# Patient Record
Sex: Female | Born: 1995 | Race: Black or African American | Hispanic: Yes | Marital: Single | Smoking: Current some day smoker
Health system: Southern US, Community
[De-identification: ages and names within clinical notes are randomized; demographics above are authoritative.]

## PROBLEM LIST (undated history)

## (undated) DIAGNOSIS — F319 Bipolar disorder, unspecified: Secondary | ICD-10-CM

## (undated) DIAGNOSIS — F909 Attention-deficit hyperactivity disorder, unspecified type: Secondary | ICD-10-CM

## (undated) DIAGNOSIS — G4700 Insomnia, unspecified: Secondary | ICD-10-CM

## (undated) DIAGNOSIS — F419 Anxiety disorder, unspecified: Secondary | ICD-10-CM

---
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
## 2018-12-18 ENCOUNTER — Emergency Department (HOSPITAL_COMMUNITY)
Admission: EM | Admit: 2018-12-18 | Discharge: 2018-12-18 | Disposition: A | Discharge status: Home / Self Care | Payer: Self-pay | Attending: Emergency Medicine | Admitting: Emergency Medicine

## 2018-12-18 ENCOUNTER — Emergency Department (HOSPITAL_COMMUNITY): Payer: Self-pay

## 2018-12-18 ENCOUNTER — Other Ambulatory Visit: Payer: Self-pay

## 2018-12-18 ENCOUNTER — Encounter (HOSPITAL_COMMUNITY): Payer: Self-pay

## 2018-12-18 DIAGNOSIS — D271 Benign neoplasm of left ovary: Secondary | ICD-10-CM | POA: Insufficient documentation

## 2018-12-18 DIAGNOSIS — N946 Dysmenorrhea, unspecified: Secondary | ICD-10-CM | POA: Insufficient documentation

## 2018-12-18 DIAGNOSIS — Z79899 Other long term (current) drug therapy: Secondary | ICD-10-CM | POA: Insufficient documentation

## 2018-12-18 DIAGNOSIS — F17200 Nicotine dependence, unspecified, uncomplicated: Secondary | ICD-10-CM | POA: Insufficient documentation

## 2018-12-18 DIAGNOSIS — R102 Pelvic and perineal pain: Secondary | ICD-10-CM

## 2018-12-18 DIAGNOSIS — F419 Anxiety disorder, unspecified: Secondary | ICD-10-CM

## 2018-12-18 HISTORY — DX: Anxiety disorder, unspecified: F41.9

## 2018-12-18 LAB — COMPREHENSIVE METABOLIC PANEL
ALK PHOS: 71 U/L (ref 38–126)
ALT: 11 U/L (ref 0–44)
AST: 19 U/L (ref 15–41)
Albumin: 4.2 g/dL (ref 3.5–5.0)
Anion gap: 10 (ref 5–15)
BUN: 10 mg/dL (ref 6–20)
CALCIUM: 9.2 mg/dL (ref 8.9–10.3)
CO2: 25 mmol/L (ref 22–32)
Chloride: 104 mmol/L (ref 98–111)
Creatinine, Ser: 0.83 mg/dL (ref 0.44–1.00)
GFR calc Af Amer: >60 mL/min (ref >60)
GFR calc non Af Amer: >60 mL/min (ref >60)
Glucose, Bld: 104 mg/dL — ABNORMAL HIGH (ref 70–99)
Potassium: 2.9 mmol/L — ABNORMAL LOW (ref 3.5–5.1)
SODIUM: 139 mmol/L (ref 135–145)
Total Bilirubin: 0.3 mg/dL (ref 0.3–1.2)
Total Protein: 8.3 g/dL — ABNORMAL HIGH (ref 6.5–8.1)

## 2018-12-18 LAB — I-STAT BETA HCG BLOOD, ED (MC, WL, AP ONLY): I-stat hCG, quantitative: <5 m[IU]/mL (ref <5)

## 2018-12-18 LAB — CBC WITH DIFFERENTIAL/PLATELET
Abs Immature Granulocytes: 0.03 10*3/uL (ref 0.00–0.07)
Basophils Absolute: 0.1 10*3/uL (ref 0.0–0.1)
Basophils Relative: 1 %
EOS ABS: 0.5 10*3/uL (ref 0.0–0.5)
Eosinophils Relative: 6 %
HCT: 46.2 % — ABNORMAL HIGH (ref 36.0–46.0)
Hemoglobin: 14.7 g/dL (ref 12.0–15.0)
IMMATURE GRANULOCYTES: 0 %
Lymphocytes Relative: 31 %
Lymphs Abs: 2.7 10*3/uL (ref 0.7–4.0)
MCH: 27.6 pg (ref 26.0–34.0)
MCHC: 31.8 g/dL (ref 30.0–36.0)
MCV: 86.8 fL (ref 80.0–100.0)
Monocytes Absolute: 0.9 10*3/uL (ref 0.1–1.0)
Monocytes Relative: 10 %
Neutro Abs: 4.7 10*3/uL (ref 1.7–7.7)
Neutrophils Relative %: 52 %
Platelets: 362 10*3/uL (ref 150–400)
RBC: 5.32 MIL/uL — ABNORMAL HIGH (ref 3.87–5.11)
RDW: 14.6 % (ref 11.5–15.5)
WBC: 8.9 10*3/uL (ref 4.0–10.5)
nRBC: 0 % (ref 0.0–0.2)

## 2018-12-18 LAB — WET PREP, GENITAL
Sperm: NONE SEEN
Trich, Wet Prep: NONE SEEN
Yeast Wet Prep HPF POC: NONE SEEN

## 2018-12-18 MED ORDER — NAPROXEN SODIUM 550 MG PO TABS: 550.0000 mg | ORAL_TABLET | Freq: Two times a day (BID) | ORAL | 0 refills | Status: DC

## 2018-12-18 MED ORDER — KETOROLAC TROMETHAMINE 60 MG/2ML IM SOLN
30.0000 mg | Freq: Once | INTRAMUSCULAR | Status: AC
  Administered 2018-12-18: 30 mg via INTRAMUSCULAR
  Filled 2018-12-18: qty 2

## 2018-12-18 NOTE — ED Triage Notes (Signed)
Patient from home. Patient is AOx4 and ambulatory. Patient presents today with mid suprapubic pain that began Monday afternoon. Patient states that pain is always present and increases and decreases in intensity, however when walking pain is somewhat relieved. Patient states she uses morning after pill for birth control now due to not being able to afford other forms of Brith control.

## 2018-12-18 NOTE — Discharge Instructions (Addendum)
You have cultures pending. If they are positive someone will call you. You will need to follow up with your health care provider for recheck in a couple weeks due to the cyst on your ovary or you can call the GYN office to schedule follow up. Take the medication as directed for pain. Return here as needed.

## 2018-12-18 NOTE — ED Provider Notes (Signed)
[facility] DEPT Provider Note   CSN: [id number] Arrival date & time: 12/18/18  1133    History   Chief Complaint Chief Complaint  Patient presents with   Abdominal Pain    HPI Jenna Hunt is a 23 y.o. female who presents to the ED with lower abdominal cramping that started yesterday. Patient reports menses currently and she often gets cramps before her period. Patient uses condoms and "morning after pill" for birth control.     HPI  Past Medical History:  Diagnosis Date   Anxiety 12/18/2018   Patient states she has anxiety, unable to verify.    There are no active problems to display for this patient.   History reviewed. No pertinent surgical history.   OB History   No obstetric history on file.      Home Medications    Prior to Admission medications   Medication Sig Start Date End Date Taking? Authorizing Provider  Sertraline HCl (ZOLOFT PO) Take 1 tablet by mouth daily.   Yes [provider]  naproxen sodium (ANAPROX DS) 550 MG tablet Take 1 tablet (550 mg total) by mouth 2 (two) times daily with a meal. 12/18/18   Ashley Murrain, NP    Family History No family history on file.  Social History Social History   Tobacco Use   Smoking status: Current Some Day Smoker    Packs/day: 0.10    Years: 10.00    Pack years: 1.00   Smokeless tobacco: Never Used  Substance Use Topics   Alcohol use: Yes    Comment: social drinker   Drug use: Not Currently    Comment: CBD oil now.      Allergies   Patient has no known allergies.   Review of Systems Review of Systems  Constitutional: Positive for chills. Negative for fever.  HENT: Negative.   Eyes: Negative for discharge, redness, itching and visual disturbance.  Respiratory: Negative for cough and shortness of breath.   Cardiovascular: Negative for chest pain.  Gastrointestinal: Positive for abdominal pain. Negative for nausea and vomiting.  Genitourinary:  Positive for vaginal bleeding. Negative for dysuria, frequency, urgency and vaginal discharge.  Musculoskeletal: Negative for back pain and myalgias.  Skin: Negative for rash.  Neurological: Negative for syncope and headaches.  Psychiatric/Behavioral: Negative for confusion.     Physical Exam Updated Vital Signs BP 137/87    Pulse (!) 59    Temp 98.2 F (36.8 C) (Oral)    Resp 15    Ht 5\' 1"  (1.549 m)    Wt 59 kg    LMP 12/18/2018 (Exact Date)    SpO2 100%    BMI 24.56 kg/m   Physical Exam Vitals signs and nursing note reviewed.  Constitutional:      General: She is not in acute distress.    Appearance: She is well-developed.  HENT:     Head: Normocephalic.  Neck:     Musculoskeletal: Neck supple.  Cardiovascular:     Rate and Rhythm: Normal rate.  Pulmonary:     Effort: Pulmonary effort is normal.  Abdominal:     Palpations: Abdomen is soft.     Tenderness: There is abdominal tenderness in the right lower quadrant. There is no guarding or rebound.  Genitourinary:    Cervix: No cervical motion tenderness.     Uterus: Normal.      Adnexa:        Right: Tenderness present.      Comments:  External genitalia without lesions, scant blood vaginal vault. Right adnexal tenderness.  Musculoskeletal: Normal range of motion.  Skin:    General: Skin is warm and dry.  Neurological:     Mental Status: She is alert and oriented to person, place, and time.     Cranial Nerves: No cranial nerve deficit.  Psychiatric:        Mood and Affect: Mood normal.      ED Treatments / Results  Labs (all labs ordered are listed, but only abnormal results are displayed) Labs Reviewed  WET PREP, GENITAL - Abnormal; Notable for the following components:      Result Value   Clue Cells Wet Prep HPF POC PRESENT (*)    WBC, Wet Prep HPF POC FEW (*)    All other components within normal limits  CBC WITH DIFFERENTIAL/PLATELET - Abnormal; Notable for the following components:   RBC 5.32 (*)    HCT  46.2 (*)    All other components within normal limits  COMPREHENSIVE METABOLIC PANEL - Abnormal; Notable for the following components:   Potassium 2.9 (*)    Glucose, Bld 104 (*)    Total Protein 8.3 (*)    All other components within normal limits  HIV ANTIBODY (ROUTINE TESTING W REFLEX)  RPR  I-STAT BETA HCG BLOOD, ED ([facility], [facility], [facility] ONLY)  GC/CHLAMYDIA PROBE AMP ([place]) NOT AT [facility]   Radiology US Pelvic Complete W Transvaginal And Torsion R/o  Result Date: 12/18/2018 CLINICAL DATA:  Pelvic pain in a female EXAM: TRANSABDOMINAL AND TRANSVAGINAL ULTRASOUND OF PELVIS DOPPLER ULTRASOUND OF OVARIES TECHNIQUE: Both transabdominal and transvaginal ultrasound examinations of the pelvis were performed. Transabdominal technique was performed for global imaging of the pelvis including uterus, ovaries, adnexal regions, and pelvic cul-de-sac. It was necessary to proceed with endovaginal exam following the transabdominal exam to visualize the uterus, endometrium, and ovaries. Color and duplex Doppler ultrasound was utilized to evaluate blood flow to the ovaries. COMPARISON:  None FINDINGS: Uterus Measurements: 6.9 x 4.0 x 4.1 cm = volume: 59.1 mL. Retroverted. Normal morphology without mass Endometrium Thickness: 5 mm, normal.  No endometrial fluid or focal abnormality Right ovary Measurements: 3.8 x 1.7 x 1.9 cm = volume: 6.3 mL. Normal morphology without mass Left ovary Measurements: 3.4 x 1.9 x 1.9 cm = volume: 6.5 mL. Small hyperechoic nodule without shadowing 10 x 6 x 8 mm question tiny dermoid tumor. Pulsed Doppler evaluation of both ovaries demonstrates normal low-resistance arterial and venous waveforms. Other findings Small amount of free pelvic fluid at cul-de-sac. No adnexal masses otherwise seen. IMPRESSION: Question small 10 mm dermoid tumor of the LEFT ovary. Otherwise normal appearing pelvic ultrasound. No sonographic evidence of ovarian torsion. Electronically Signed   By: Lavonia Dana M.D.    On: 12/18/2018 15:05    Procedures Procedures (including critical care time)  Medications Ordered in ED Medications  ketorolac (TORADOL) injection 30 mg (30 mg Intramuscular Given 12/18/18 1253)   Symptoms improved with treatment in the ED.  Initial Impression / Assessment and Plan / ED Course  I have reviewed the triage vital signs and the nursing notes. 23 y.o. female here with pelvic pain stable for d/c without TOA, torsion or mass. Patient does have a small area that appears as a dermoid cyst. Patient will f[facility] with [facility]. Will treat with NSAIDS.   Final Clinical Impressions(s) / ED Diagnoses   Final diagnoses:  Pelvic pain in female  Dermoid cyst of  left ovary  Dysmenorrhea    ED Discharge Orders         Ordered    naproxen sodium (ANAPROX DS) 550 MG tablet  2 times daily with meals     12/18/18 1526           Janit Bern Venedy, NP 12/18/18 Wiseman, Ankit, MD 12/19/18 1312

## 2018-12-19 LAB — GC/CHLAMYDIA PROBE AMP (~~LOC~~) NOT AT ARMC
Chlamydia: NEGATIVE
Neisseria Gonorrhea: NEGATIVE

## 2018-12-19 LAB — HIV ANTIBODY (ROUTINE TESTING W REFLEX): HIV Screen 4th Generation wRfx: NONREACTIVE

## 2018-12-19 LAB — RPR: RPR Ser Ql: NONREACTIVE

## 2019-01-27 ENCOUNTER — Emergency Department (HOSPITAL_COMMUNITY)
Admission: EM | Admit: 2019-01-27 | Discharge: 2019-01-29 | Disposition: A | Discharge status: Other Acute Inpatient Hospital | Payer: Self-pay | Attending: Emergency Medicine | Admitting: Emergency Medicine

## 2019-01-27 DIAGNOSIS — F1014 Alcohol abuse with alcohol-induced mood disorder: Secondary | ICD-10-CM | POA: Diagnosis present

## 2019-01-27 DIAGNOSIS — F17210 Nicotine dependence, cigarettes, uncomplicated: Secondary | ICD-10-CM | POA: Insufficient documentation

## 2019-01-27 DIAGNOSIS — F10230 Alcohol dependence with withdrawal, uncomplicated: Secondary | ICD-10-CM | POA: Diagnosis present

## 2019-01-27 DIAGNOSIS — R45851 Suicidal ideations: Secondary | ICD-10-CM | POA: Insufficient documentation

## 2019-01-27 DIAGNOSIS — R4689 Other symptoms and signs involving appearance and behavior: Secondary | ICD-10-CM | POA: Insufficient documentation

## 2019-01-27 DIAGNOSIS — R4182 Altered mental status, unspecified: Secondary | ICD-10-CM | POA: Insufficient documentation

## 2019-01-27 DIAGNOSIS — F1410 Cocaine abuse, uncomplicated: Secondary | ICD-10-CM | POA: Diagnosis present

## 2019-01-27 DIAGNOSIS — F3131 Bipolar disorder, current episode depressed, mild: Secondary | ICD-10-CM | POA: Insufficient documentation

## 2019-01-27 LAB — COMPREHENSIVE METABOLIC PANEL
ALT: 12 U/L (ref 0–44)
AST: 21 U/L (ref 15–41)
Albumin: 4 g/dL (ref 3.5–5.0)
Alkaline Phosphatase: 67 U/L (ref 38–126)
Anion gap: 9 (ref 5–15)
BUN: 5 mg/dL — ABNORMAL LOW (ref 6–20)
CO2: 25 mmol/L (ref 22–32)
Calcium: 8.6 mg/dL — ABNORMAL LOW (ref 8.9–10.3)
Chloride: 107 mmol/L (ref 98–111)
Creatinine, Ser: 0.71 mg/dL (ref 0.44–1.00)
GFR calc Af Amer: >60 mL/min (ref >60)
GFR calc non Af Amer: >60 mL/min (ref >60)
Glucose, Bld: 104 mg/dL — ABNORMAL HIGH (ref 70–99)
Potassium: 2.8 mmol/L — ABNORMAL LOW (ref 3.5–5.1)
Sodium: 141 mmol/L (ref 135–145)
Total Bilirubin: 0.6 mg/dL (ref 0.3–1.2)
Total Protein: 7.5 g/dL (ref 6.5–8.1)

## 2019-01-27 LAB — CBC
HCT: 44.9 % (ref 36.0–46.0)
Hemoglobin: 14.6 g/dL (ref 12.0–15.0)
MCH: 28.1 pg (ref 26.0–34.0)
MCHC: 32.5 g/dL (ref 30.0–36.0)
MCV: 86.5 fL (ref 80.0–100.0)
Platelets: 324 10*3/uL (ref 150–400)
RBC: 5.19 MIL/uL — ABNORMAL HIGH (ref 3.87–5.11)
RDW: 13.5 % (ref 11.5–15.5)
WBC: 14.4 10*3/uL — ABNORMAL HIGH (ref 4.0–10.5)
nRBC: 0 % (ref 0.0–0.2)

## 2019-01-27 LAB — I-STAT BETA HCG BLOOD, ED (MC, WL, AP ONLY): I-stat hCG, quantitative: <5 m[IU]/mL (ref <5)

## 2019-01-27 MED ORDER — SODIUM CHLORIDE 0.9 % IV BOLUS
1000.0000 mL | Freq: Once | INTRAVENOUS | Status: AC
  Administered 2019-01-27: 1000 mL via INTRAVENOUS

## 2019-01-27 NOTE — ED Notes (Signed)
Bed: QB34 Expected date:  Expected time:  Means of arrival:  Comments: Held/combative female

## 2019-01-27 NOTE — ED Notes (Signed)
Patient arrives by [facility] with [employer] restrained to EMS stretcher-patient has abrasions to both knees with bleeding controlled-small abrasions x 2 around outer right nare and right front tooth noted to be broken on exam

## 2019-01-27 NOTE — ED Provider Notes (Signed)
Medical screening examination/treatment/procedure(s) were conducted as a shared visit with non-physician practitioner(s) and myself.  I personally evaluated the patient during the encounter. Briefly, the patient is a 23 y.o. female with no significant medical history presents the ED with aggressive and combative behavior, she was suicidal with EMS.  Due to being combative she got IM Versed and is now sedated.  Was ambulatory and trying to elope from the EMS.  Patient responds to pain, breathing on her own, however is sedated.  Pupils are overall normal.  We will get head CT, psych screening labs.  Will place on end-tidal CO2 and allow her to metabolize substances.  Right now patient protecting airway.  After she metabolizes Versed and other possible substances will likely need psychiatric evaluation.  Will re-evaluate after she metabolizes to figure out what is going on with the patient.  Physician assistant will continue work-up.  This chart was dictated using voice recognition software.  Despite best efforts to proofread,  errors can occur which can change the documentation meaning.     EKG Interpretation  Date/Time:  Sunday January 27 2019 22:40:52 EDT Ventricular Rate:  66 PR Interval:    QRS Duration: 101 QT Interval:  406 QTC Calculation: 426 R Axis:   10 Text Interpretation:  Sinus rhythm RSR' in V1 or V2, probably normal variant Baseline wander in lead(s) V6 Confirmed by Lennice Sites [phone]) on 01/27/2019 10:53:44 PM           Lennice Sites, DO 01/28/19 0013

## 2019-01-27 NOTE — ED Provider Notes (Signed)
[facility] DEPT Provider Note   CSN: [id number] Arrival date & time: 01/27/19  2232    History   Chief Complaint Chief Complaint  Patient presents with   Aggressive Behavior    HPI Jenna Hunt is a 23 y.o. female.     Patient to ED by EMS for evaluation of SI, altered mental status, combative behavior. Police report they were called in regard to the patient walking down the middle of the road, altered, stating she wanted to kill herself. During transportation she started vomiting in the police car, became combative, and EMS was called. The patient was medicated with Versed in route and arrives unconscious, unable to contribute to history. [employer] filing an IVC petition.   The history is provided by the EMS personnel and the police. No language interpreter was used.    Past Medical History:  Diagnosis Date   Anxiety 12/18/2018   Patient states she has anxiety, unable to verify.    There are no active problems to display for this patient.   No past surgical history on file.   OB History   No obstetric history on file.      Home Medications    Prior to Admission medications   Medication Sig Start Date End Date Taking? Authorizing Provider  naproxen sodium (ANAPROX DS) 550 MG tablet Take 1 tablet (550 mg total) by mouth 2 (two) times daily with a meal. 12/18/18   Neese, [place], NP  Sertraline HCl (ZOLOFT PO) Take 1 tablet by mouth daily.    [provider]    Family History No family history on file.  Social History Social History   Tobacco Use   Smoking status: Current Some Day Smoker    Packs/day: 0.10    Years: 10.00    Pack years: 1.00   Smokeless tobacco: Never Used  Substance Use Topics   Alcohol use: Yes    Comment: social drinker   Drug use: Not Currently    Comment: CBD oil now.      Allergies   Patient has no known allergies.   Review of Systems Review of Systems  Unable to perform ROS:  Patient unresponsive     Physical Exam Updated Vital Signs BP 104/71 (BP Location: Left Arm)    Pulse 69    Resp 19    Ht 5\' 6"  (1.676 m)    Wt 65.8 kg    SpO2 100%    BMI 23.40 kg/m   Physical Exam Vitals signs and nursing note reviewed.  HENT:     Mouth/Throat:     Mouth: Mucous membranes are moist.  Eyes:     Comments: Pupils pinpoint bilaterally.  Cardiovascular:     Rate and Rhythm: Normal rate.  Pulmonary:     Effort: Pulmonary effort is normal.  Abdominal:     Palpations: Abdomen is soft.  Musculoskeletal:     Comments: No bony deformities.  Skin:    Comments: Abrasion to anterior left knee. Minor facial abrasions to inferior right eye and right paranasal area.   Neurological:     Comments: Unable to test. Minimal response to painful stimuli.      ED Treatments / Results  Labs (all labs ordered are listed, but only abnormal results are displayed) Labs Reviewed  COMPREHENSIVE METABOLIC PANEL  ETHANOL  SALICYLATE LEVEL  ACETAMINOPHEN LEVEL  CBC  RAPID URINE DRUG SCREEN, HOSP PERFORMED  I-STAT BETA HCG BLOOD, ED ([facility], [facility], [facility] ONLY)  EKG None  Radiology No results found.  Procedures Procedures (including critical care time) CRITICAL CARE Performed by: Dewaine Oats   Total critical care time: 50 minutes  Critical care time was exclusive of separately billable procedures and treating other patients.  Critical care was necessary to treat or prevent imminent or life-threatening deterioration.  Critical care was time spent personally by me on the following activities: development of treatment plan with patient and/or surrogate as well as nursing, discussions with consultants, evaluation of patient's response to treatment, examination of patient, obtaining history from patient or surrogate, ordering and performing treatments and interventions, ordering and review of laboratory studies, ordering and review of radiographic studies, pulse oximetry and  re-evaluation of patient's condition.  Medications Ordered in ED Medications  sodium chloride 0.9 % bolus 1,000 mL (has no administration in time range)     Initial Impression / Assessment and Plan / ED Course  I have reviewed the triage vital signs and the nursing notes.  Pertinent labs & imaging results that were available during my care of the patient were reviewed by me and considered in my medical decision making (see chart for details).        Patient to ED by EMS and [employer], picked up for altered mental status, stating SI, combative. Medicated prior to arrival and sedated on presentation. VSS. Mildly hypotensive, similar to previous charted VS. O2 saturations 90-91% - started on 2L.   1:00 - patient returning from CT. Still sedated but more easily arousable. Labs support alcohol intoxication. Mild hypokalemia - supplemental K+ ordered.    1:40 - Head CT negative for acute bleed or evidence of trauma. VSS.   3:00 - patient still sleeping. VSS. Temperature improving with bair hugger.   4:30 - patient wakens to verbal stimuli, but falls back to sleep quickly. She remembers being at the [employer] [facility] but can give no other details of history. With work up being negative for acute infection or evidence of trauma. AMS felt likely due to intoxication. IVC petition in place by [employer]. Will request TTS consultation when she is more awake.   6:00 - TTS consult delayed due to teleprompter not functioning.   Patient care signed out to Dr. Stark Jock pending TTS consultation and disposition recommendation.  Final Clinical Impressions(s) / ED Diagnoses   Final diagnoses:  None   1. Altered mental status 2. Intoxication 3. Suicidal ideation  ED Discharge Orders    None       Charlann Lange, Hershal Coria 01/29/19 0636    Lennice Sites, DO 01/30/19 1000

## 2019-01-27 NOTE — ED Triage Notes (Signed)
Pt arrived via [facility] due to a call about her wandering in traffic, per EMS pt stated she had SI. Pt on the way to hospital became aggressive, pt spitting, hitting, and threatening and attempting to pee on EMS staff. Given 5mg  Versed IM. IVC paperwork is in process.

## 2019-01-28 ENCOUNTER — Other Ambulatory Visit: Payer: Self-pay

## 2019-01-28 ENCOUNTER — Emergency Department (HOSPITAL_COMMUNITY): Payer: Self-pay

## 2019-01-28 DIAGNOSIS — F1014 Alcohol abuse with alcohol-induced mood disorder: Secondary | ICD-10-CM | POA: Diagnosis present

## 2019-01-28 DIAGNOSIS — F1410 Cocaine abuse, uncomplicated: Secondary | ICD-10-CM | POA: Diagnosis present

## 2019-01-28 DIAGNOSIS — F10230 Alcohol dependence with withdrawal, uncomplicated: Secondary | ICD-10-CM | POA: Diagnosis present

## 2019-01-28 LAB — RAPID URINE DRUG SCREEN, HOSP PERFORMED
Amphetamines: NOT DETECTED
Barbiturates: NOT DETECTED
Benzodiazepines: POSITIVE — AB
Cocaine: NOT DETECTED
Opiates: NOT DETECTED
Tetrahydrocannabinol: POSITIVE — AB

## 2019-01-28 LAB — POTASSIUM: Potassium: 3.2 mmol/L — ABNORMAL LOW (ref 3.5–5.1)

## 2019-01-28 LAB — SALICYLATE LEVEL: Salicylate Lvl: <7 mg/dL (ref 2.8–30.0)

## 2019-01-28 LAB — ACETAMINOPHEN LEVEL: Acetaminophen (Tylenol), Serum: <10 ug/mL — ABNORMAL LOW (ref 10–30)

## 2019-01-28 LAB — ETHANOL: Alcohol, Ethyl (B): 259 mg/dL — ABNORMAL HIGH (ref <10)

## 2019-01-28 MED ORDER — VITAMIN B-1 100 MG PO TABS: 100.0000 mg | ORAL_TABLET | Freq: Every day | ORAL | Status: DC

## 2019-01-28 MED ORDER — ONDANSETRON HCL 4 MG/2ML IJ SOLN
4.0000 mg | Freq: Once | INTRAMUSCULAR | Status: DC
  Filled 2019-01-28: qty 2

## 2019-01-28 MED ORDER — LORAZEPAM 2 MG/ML IJ SOLN: 0.0000 mg | Freq: Four times a day (QID) | INTRAMUSCULAR | Status: DC

## 2019-01-28 MED ORDER — LORAZEPAM 1 MG PO TABS: 0.0000 mg | ORAL_TABLET | Freq: Two times a day (BID) | ORAL | Status: DC

## 2019-01-28 MED ORDER — BACITRACIN-NEOMYCIN-POLYMYXIN 400-5-5000 EX OINT
TOPICAL_OINTMENT | CUTANEOUS | Status: DC | PRN
  Administered 2019-01-28: 2 via TOPICAL
  Filled 2019-01-28: qty 2

## 2019-01-28 MED ORDER — ACETAMINOPHEN 325 MG PO TABS
650.0000 mg | ORAL_TABLET | ORAL | Status: DC | PRN
  Administered 2019-01-29: 650 mg via ORAL
  Filled 2019-01-28: qty 2

## 2019-01-28 MED ORDER — ACETAMINOPHEN 325 MG PO TABS
650.0000 mg | ORAL_TABLET | Freq: Once | ORAL | Status: AC
  Administered 2019-01-28: 21:00:00 650 mg via ORAL
  Filled 2019-01-28: qty 2

## 2019-01-28 MED ORDER — POTASSIUM CHLORIDE 10 MEQ/100ML IV SOLN
10.0000 meq | Freq: Once | INTRAVENOUS | Status: AC
  Administered 2019-01-28: 10 meq via INTRAVENOUS
  Filled 2019-01-28: qty 100

## 2019-01-28 MED ORDER — THIAMINE HCL 100 MG/ML IJ SOLN: 100.0000 mg | Freq: Every day | INTRAMUSCULAR | Status: DC

## 2019-01-28 MED ORDER — LORAZEPAM 2 MG/ML IJ SOLN: 0.0000 mg | Freq: Two times a day (BID) | INTRAMUSCULAR | Status: DC

## 2019-01-28 MED ORDER — LORAZEPAM 1 MG PO TABS: 0.0000 mg | ORAL_TABLET | Freq: Four times a day (QID) | ORAL | Status: DC

## 2019-01-28 NOTE — Care Management (Signed)
Writer spoke to TTS at [facility] and they are reviewing the patient for possible placement.

## 2019-01-28 NOTE — ED Notes (Addendum)
Pt made aware of potentially going to [place]. Pt informs this RN that she cannot leave county due to probation. Pt states she will try to get in touch with her probation officer.

## 2019-01-28 NOTE — ED Notes (Signed)
Attempted to call [facility] for room in obs area. No answer.

## 2019-01-28 NOTE — ED Notes (Signed)
Report given to [facility]

## 2019-01-28 NOTE — BHH Counselor (Signed)
Clinician called the [facility] TTS assessment cart however, clinician received a message, "call ended, please try again later." Clinician contacted Jenna Hunt and asked her to restart the cart and she will call back in about 7 minutes.    Jenna Hunt, [place], [facility], [facility] Triage Specialist [phone]

## 2019-01-28 NOTE — ED Notes (Signed)
Pt asleep in room. Updated white board

## 2019-01-28 NOTE — Progress Notes (Signed)
Pt. meets criteria for inpatient treatment per Mordecai Maes, NP.  No appropriate beds available at [facility]. Referred out to the following hospitals:  [facility]  [facility]     Disposition CSW will continue to follow for placement.  Areatha Keas. Judi Cong, MSW, [place] Disposition Clinical Social Work [phone] (cell) [phone] (office)

## 2019-01-28 NOTE — ED Notes (Signed)
Called [facility] for placement, awaiting room assignment

## 2019-01-28 NOTE — ED Notes (Signed)
[employer] called to transport patient.

## 2019-01-28 NOTE — ED Notes (Signed)
3 bags of belongings labeled with green tags and placed in cabinet by secretary desk labeled belongings for Rms 13-15

## 2019-01-28 NOTE — ED Notes (Signed)
[facility] called back, saying they are now unable to take the pt in the obs unit.

## 2019-01-28 NOTE — BH Assessment (Signed)
[place] Assessment Progress Note    Case was staffed with Mariea Clonts DO who recommended a inpatient admission.

## 2019-01-28 NOTE — BHH Counselor (Addendum)
Clinician called the [facility] TTS cart numerous times and continued to get the message, "call ended, please try again later." Clinician spoke to Margaretha Sheffield, [place] and noted she was going to call in an IT ticket for the cart. Margaretha Sheffield, RN reported, the pt is not alert. Clinician asked Margaretha Sheffield, RN to have day shift call [phone] when the cart is fixed and the pt is alert.    Vertell Novak, [place], [facility], [facility] Triage Specialist [phone]

## 2019-01-28 NOTE — ED Notes (Signed)
TTS at bedside. 

## 2019-01-28 NOTE — ED Notes (Signed)
Attempted to call OBS unit for pt placement. No answer.

## 2019-01-28 NOTE — ED Notes (Signed)
Pt resting quietly.

## 2019-01-28 NOTE — ED Notes (Signed)
TTS called, they will come across the street for eval

## 2019-01-28 NOTE — ED Notes (Signed)
Pt had several episodes of emesis in room. This RN got zofran. Pt refused saying "all I need is 2 cigarettes. Ya'll have been not letting me have a cigarette all night".

## 2019-01-28 NOTE — ED Notes (Signed)
Attempted to call [facility] obs unit. No answer.

## 2019-01-28 NOTE — ED Notes (Signed)
Pt ambulatory to shower °
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
## 2019-01-28 NOTE — ED Notes (Signed)
Pt eating lunch tray at this time  

## 2019-01-28 NOTE — BHH Counselor (Signed)
IVC paperwork to be faxed to [phone]. Then pt's TTS assessment will be completed. Will call [facility] if faxed did not go through.    Vertell Novak, [place], [facility], [facility] Triage Specialist [phone]
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
## 2019-01-28 NOTE — BHH Counselor (Signed)
Per Estill Bamberg she will notify the RN to set up the cart.   Vertell Novak, [place], [facility], [facility] Triage Specialist [phone]

## 2019-01-28 NOTE — ED Notes (Signed)
Pt stated she was going to throw up if she ate. Refused breakfast tray.

## 2019-01-28 NOTE — ED Notes (Signed)
Called [facility] for pt placement. Pt is going to be treated outside of [facility]. This RN asked if pt could be placed in OBS area. Lawrence to call back.

## 2019-01-29 ENCOUNTER — Other Ambulatory Visit: Payer: Self-pay

## 2019-01-29 ENCOUNTER — Encounter: Payer: Self-pay | Admitting: Behavioral Health

## 2019-01-29 ENCOUNTER — Inpatient Hospital Stay
Admission: AD | Admit: 2019-01-29 | Discharge: 2019-01-30 | DRG: 885 | Disposition: A | Discharge status: Home / Self Care | Payer: No Typology Code available for payment source | Attending: Psychiatry | Admitting: Psychiatry

## 2019-01-29 DIAGNOSIS — F1024 Alcohol dependence with alcohol-induced mood disorder: Secondary | ICD-10-CM | POA: Diagnosis present

## 2019-01-29 DIAGNOSIS — G4700 Insomnia, unspecified: Secondary | ICD-10-CM | POA: Diagnosis present

## 2019-01-29 DIAGNOSIS — F17210 Nicotine dependence, cigarettes, uncomplicated: Secondary | ICD-10-CM | POA: Diagnosis present

## 2019-01-29 DIAGNOSIS — R45851 Suicidal ideations: Secondary | ICD-10-CM | POA: Diagnosis present

## 2019-01-29 DIAGNOSIS — F3130 Bipolar disorder, current episode depressed, mild or moderate severity, unspecified: Secondary | ICD-10-CM | POA: Diagnosis present

## 2019-01-29 DIAGNOSIS — F1290 Cannabis use, unspecified, uncomplicated: Secondary | ICD-10-CM | POA: Diagnosis present

## 2019-01-29 DIAGNOSIS — F10230 Alcohol dependence with withdrawal, uncomplicated: Secondary | ICD-10-CM | POA: Diagnosis present

## 2019-01-29 DIAGNOSIS — Z818 Family history of other mental and behavioral disorders: Secondary | ICD-10-CM | POA: Diagnosis not present

## 2019-01-29 DIAGNOSIS — F1994 Other psychoactive substance use, unspecified with psychoactive substance-induced mood disorder: Secondary | ICD-10-CM

## 2019-01-29 DIAGNOSIS — F319 Bipolar disorder, unspecified: Secondary | ICD-10-CM

## 2019-01-29 DIAGNOSIS — F419 Anxiety disorder, unspecified: Secondary | ICD-10-CM | POA: Diagnosis present

## 2019-01-29 DIAGNOSIS — F1014 Alcohol abuse with alcohol-induced mood disorder: Secondary | ICD-10-CM | POA: Diagnosis present

## 2019-01-29 MED ORDER — ACETAMINOPHEN 325 MG PO TABS: 650.0000 mg | ORAL_TABLET | Freq: Four times a day (QID) | ORAL | Status: DC | PRN

## 2019-01-29 MED ORDER — VITAMIN B-1 100 MG PO TABS
100.0000 mg | ORAL_TABLET | Freq: Every day | ORAL | Status: DC
  Administered 2019-01-29: 100 mg via ORAL
  Filled 2019-01-29: qty 1

## 2019-01-29 MED ORDER — MAGNESIUM HYDROXIDE 400 MG/5ML PO SUSP: 30.0000 mL | Freq: Every day | ORAL | Status: DC | PRN

## 2019-01-29 MED ORDER — ALUM & MAG HYDROXIDE-SIMETH 200-200-20 MG/5ML PO SUSP: 30.0000 mL | ORAL | Status: DC | PRN

## 2019-01-29 MED ORDER — HYDROXYZINE HCL 25 MG PO TABS: 25.0000 mg | ORAL_TABLET | Freq: Three times a day (TID) | ORAL | Status: DC | PRN

## 2019-01-29 MED ORDER — THIAMINE HCL 100 MG/ML IJ SOLN: 100.0000 mg | Freq: Every day | INTRAMUSCULAR | Status: DC

## 2019-01-29 MED ORDER — ARIPIPRAZOLE 5 MG PO TABS
5.0000 mg | ORAL_TABLET | Freq: Every day | ORAL | Status: DC
  Administered 2019-01-29 – 2019-01-30 (×2): 5 mg via ORAL
  Filled 2019-01-29 (×2): qty 1

## 2019-01-29 NOTE — ED Provider Notes (Signed)
Pt accepted to [facility] by PA [place] Silos.  She remains stable for transfer.   Isla Pence, MD 01/29/19 [phone]
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
## 2019-01-29 NOTE — ED Notes (Signed)
Report handed off from previous sitter for break @ 0000. Charting not completed by writer until 0011.

## 2019-01-29 NOTE — ED Notes (Signed)
Sheriff on unit to transfer pt to [place] per MD order. Personal property given to sheriff for transfer. Ambulatory off unit in law enforcement custody.

## 2019-01-29 NOTE — ED Notes (Signed)
Pt pleasant on approach, voicing no complaints at this time, pt denies SI. Encouragement and support provided. Safety sitter present. Will continue to monitor.

## 2019-01-29 NOTE — Tx Team (Signed)
Initial Treatment Plan 01/29/2019 11:39 AM Ambrose Pancoast UCJ:[id number]    PATIENT STRESSORS: Legal issue Medication change or noncompliance Substance abuse Traumatic event   PATIENT STRENGTHS: Ability for insight Capable of independent living Communication skills   PATIENT IDENTIFIED PROBLEMS: Suicidal 01/29/2019  Depression  01/29/2019  Substance Abuse  01/29/2019                 DISCHARGE CRITERIA:  Ability to meet basic life and health needs Improved stabilization in mood, thinking, and/or behavior  PRELIMINARY DISCHARGE PLAN: Outpatient therapy Return to previous living arrangement  PATIENT/FAMILY INVOLVEMENT: This treatment plan has been presented to and reviewed with the patient, Jenna Hunt, and/or family member,  .  The patient and family have been given the opportunity to ask questions and make suggestions.  Leodis Liverpool, RN 01/29/2019, 11:39 AM

## 2019-01-29 NOTE — Plan of Care (Signed)
  Problem: Education: Goal: Knowledge of [place] [facility] Education information/materials will improve Note:  Instructed patient on unit programing , hand washing and [place]  with booklet , able to verbalize understanding

## 2019-01-29 NOTE — Progress Notes (Signed)
Recreation Therapy Notes  INPATIENT RECREATION THERAPY ASSESSMENT  Patient Details Name: Jenna Hunt MRN: [id number] DOB: 02-05-1996 Today's Date: 01/29/2019       Information Obtained From: Patient  Able to Participate in Assessment/Interview: Yes  Patient Presentation: Responsive  Reason for Admission (Per Patient): Active Symptoms  Patient Stressors:    Coping Skills:   Substance Abuse  Leisure Interests (2+):  Exercise - Walking, Music - Listen  Frequency of Recreation/Participation: Weekly  Awareness of Community Resources:     [employer]:     Current Use:    If no, Barriers?:    Expressed Interest in [employer] Information:    [employer] of Residence:  [place]  Patient Main Form of Transportation: Diplomatic Services operational officer  Patient Strengths:  N/A  Patient Identified Areas of Improvement:  N/A  Patient Goal for Hospitalization:  N/A  Current SI (including self-harm):  No  Current HI:  No  Current AVH: No  Staff Intervention Plan: Group Attendance, Collaborate with Interdisciplinary Treatment Team  Consent to Intern Participation: N/A  Taggert Bozzi 01/29/2019, 12:33 PM

## 2019-01-29 NOTE — ED Notes (Signed)
Pt ambulatory to bathroom without difficulty, safety sitter present.
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
## 2019-01-29 NOTE — ED Notes (Signed)
CIWA assessment deferred at this time, pt currently sleeping. Safety sitter present.

## 2019-01-29 NOTE — ED Notes (Signed)
Gave pt chicken noodle soup, crackers, and water.
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
## 2019-01-29 NOTE — ED Notes (Signed)
Meal tray given 

## 2019-01-29 NOTE — ED Notes (Signed)
Attempted to call nursing report to [place]- Informs this nurse will call back.

## 2019-01-29 NOTE — [facility] Group Notes (Signed)
  LCSW Group Therapy Note  01/29/2019 1:01 PM   Type of Therapy/Topic:  Group Therapy:  Feelings about Diagnosis  Participation Level:  Did Not Attend   Description of Group:   This group will allow patients to explore their thoughts and feelings about diagnoses they have received. Patients will be guided to explore their level of understanding and acceptance of these diagnoses. Facilitator will encourage patients to process their thoughts and feelings about the reactions of others to their diagnosis and will guide patients in identifying ways to discuss their diagnosis with significant others in their lives. This group will be process-oriented, with patients participating in exploration of their own experiences, giving and receiving support, and processing challenge from other group members.   Therapeutic Goals: 1. Patient will demonstrate understanding of diagnosis as evidenced by identifying two or more symptoms of the disorder 2. Patient will be able to express two feelings regarding the diagnosis 3. Patient will demonstrate their ability to communicate their needs through discussion and/or role play  Summary of Patient Progress: x   Therapeutic Modalities:   Cognitive Behavioral Therapy Brief Therapy Feelings Identification    Evalina Field, MSW, LCSW Clinical Social Work 01/29/2019 1:01 PM

## 2019-01-30 MED ORDER — ARIPIPRAZOLE 5 MG PO TABS: 5.0000 mg | ORAL_TABLET | Freq: Every day | ORAL | 1 refills | Status: DC

## 2019-01-30 NOTE — Progress Notes (Signed)
Recreation Therapy Notes  Date: 01/30/2019  Time: 9:30 am  Location: Craft Room  Behavioral response: Appropriate  Intervention Topic: Communication  Discussion/Intervention:  Group content today was focused on communication. The group defined communication and ways to communicate with others. Individuals stated reason why communication is important and some reasons to communicate with others. Patients expressed if they thought they were good at communicating with others and ways they could improve their communication skills. The group identified important parts of communication and some experiences they have had in the past with communication. The group participated in the intervention "What is that?", where they had a chance to test out their communication skills and identify ways to improve their communication techniques.  Clinical Observations/Feedback:  Patient came to group and defined communication as body language. She expressed that she communicates with others by using music and food. Participant rated her communication skills as a 5/10 she explained that sometime she shuts down. Patient stated that compatibility is the most important part of communication. Individual was social with peers and staff while participating in the intervention. Lokelani Lutes LRT/CTRS         Alyx Mcguirk 01/30/2019 11:01 AM

## 2019-01-30 NOTE — Progress Notes (Signed)
Patient denies any SI/HI/AVH as well as denies depressions and anxiety, patient  verbalized understanding of safety guidelines and expected behaviors, and compliant with her medications , patient is adjusting well and developing adequate coping skills, sleep is continuous with out any disturbance only requiring 15 minutes safety checks and no distress.
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
## 2019-01-30 NOTE — BHH Group Notes (Signed)
LCSW Group Therapy Note  01/30/2019 1:00 PM  Type of Therapy/Topic:  Group Therapy:  Emotion Regulation  Participation Level:  Active   Description of Group:   The purpose of this group is to assist patients in learning to regulate negative emotions and experience positive emotions. Patients will be guided to discuss ways in which they have been vulnerable to their negative emotions. These vulnerabilities will be juxtaposed with experiences of positive emotions or situations, and patients will be challenged to use positive emotions to combat negative ones. Special emphasis will be placed on coping with negative emotions in conflict situations, and patients will process healthy conflict resolution skills.  Therapeutic Goals: 1. Patient will identify two positive emotions or experiences to reflect on in order to balance out negative emotions 2. Patient will label two or more emotions that they find the most difficult to experience 3. Patient will demonstrate positive conflict resolution skills through discussion and/or role plays  Summary of Patient Progress: Patient was present and attentive in group. Patient engaged in group discussions.  Patient shared how lack of compassion has negatively impacted her. Patient shared that it wasn't her lacking compassion but those around her.  Patient engaged in discussion on how she has utilized an emotional support animal in the past and how she uses writing, music and dancing as a coping mechanism.    Therapeutic Modalities:   Cognitive Behavioral Therapy Feelings Identification Dialectical Behavioral Therapy  Assunta Curtis, MSW, LCSW 01/30/2019 12:58 PM
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
## 2019-01-30 NOTE — Progress Notes (Signed)
Recreation Therapy Notes  INPATIENT RECREATION TR PLAN  Patient Details Name: Jenna Hunt MRN: [id number] DOB: 08-29-1996 Today's Date: 01/30/2019  Rec Therapy Plan Is patient appropriate for Therapeutic Recreation?: Yes Treatment times per week: at least 3 Estimated Length of Stay: 5-7 days TR Treatment/Interventions: Group participation (Comment)  Discharge Criteria Pt will be discharged from therapy if:: Discharged Treatment plan/goals/alternatives discussed and agreed upon by:: Patient/family  Discharge Summary Short term goals set: Patient will engage in groups without prompting or encouragement from LRT x3 group sessions within 5 recreation therapy group sessions Short term goals met: Adequate for discharge Progress toward goals comments: Groups attended Which groups?: Communication Reason goals not met: N/A Therapeutic equipment acquired: N/A Reason patient discharged from therapy: Discharge from hospital Pt/family agrees with progress & goals achieved: Yes Date patient discharged from therapy: 01/31/19   Akira Perusse 01/30/2019, 11:06 AM

## 2019-01-30 NOTE — Progress Notes (Signed)
  [facility] Adult Case Management Discharge Plan :  Will you be returning to the same living situation after discharge:  No.  Pt will be staying with her sister and sister's fiance.  At discharge, do you have transportation home?: Yes,  CSW will provide with taxi voucher.  Do you have the ability to pay for your medications: No.  Release of information consent forms completed and in the chart;  Patient's signature needed at discharge.  Patient to Follow up at: Follow-up Information    [facility]. Schedule an appointment as soon as possible for a visit.   Why:  Please call and make a follow up appointment.  Contact information: [address] [place] [address] [phone]           Next level of care provider has access to [place] and Suicide Prevention discussed: Yes,  SPE completed with pt. Pt declined family contact.       Has patient been referred to the Quitline?: Patient refused referral  Patient has been referred for addiction treatment: Yes  Rozann Lesches, LCSW 01/30/2019, 11:56 AM

## 2019-01-30 NOTE — Progress Notes (Signed)
D: Patient is aware of  Discharge this shift .Patient denies suicidal /homicidal ideations. Patient received all belongings brought in  A: No Storage medications. Writer reviewed Discharge Summary, Suicide Risk Assessment, and Transitional Record. Patient also received Prescriptions   from  MD.  Aware  Of follow up appointment . R: Patient left unit with no questions  Or concerns  Left by taxi

## 2019-01-30 NOTE — BHH Suicide Risk Assessment (Signed)
[facility] Discharge Suicide Risk Assessment   Principal Problem: Bipolar depression [facility]) Discharge Diagnoses: Principal Problem:   Bipolar depression ([place]) Active Problems:   Alcohol dependence with uncomplicated withdrawal ([place])   Alcohol abuse with alcohol-induced mood disorder ([place])   Anxiety   Substance induced mood disorder ([place])   Total Time spent with patient: 45 minutes  Musculoskeletal: Strength & Muscle Tone: within normal limits Gait & Station: normal Patient leans: N/A  Psychiatric Specialty Exam: Review of Systems  Constitutional: Negative.   HENT: Negative.   Eyes: Negative.   Respiratory: Negative.   Cardiovascular: Negative.   Gastrointestinal: Negative.   Musculoskeletal: Negative.   Skin: Negative.   Neurological: Negative.   Psychiatric/Behavioral: Positive for substance abuse. Negative for depression, hallucinations, memory loss and suicidal ideas. The patient is not nervous/anxious and does not have insomnia.     Blood pressure 135/68, pulse 61, temperature 98.8 F (37.1 C), temperature source Oral, resp. rate 16, height 5\' 1"  (1.549 m), weight 57.2 kg, SpO2 100 %.Body mass index is 23.81 kg/m.  General Appearance: Casual  Eye Contact::  Good  Speech:  Clear and [id number]  Volume:  Normal  Mood:  Dysphoric  Affect:  Constricted  Thought Process:  Goal Directed  Orientation:  Full (Time, Place, and Person)  Thought Content:  Logical  Suicidal Thoughts:  No  Homicidal Thoughts:  No  Memory:  Immediate;   Fair Recent;   Fair Remote;   Fair  Judgement:  Fair  Insight:  Fair  Psychomotor Activity:  Decreased  Concentration:  Fair  Recall:  [employer] of Knowledge:Fair  Language: Fair  Akathisia:  No  Handed:  Right  AIMS (if indicated):     Assets:  Communication Skills Desire for Improvement Financial Resources/Insurance Housing Physical Health Resilience  Sleep:  Number of Hours: 8  Cognition: WNL  ADL's:  Intact   Mental Status Per  Nursing Assessment::   On Admission:  NA  Demographic Factors:  Gay, lesbian, or bisexual orientation, Low socioeconomic status and Unemployed  Loss Factors: Loss of significant relationship and Financial problems/change in socioeconomic status  Historical Factors: Impulsivity  Risk Reduction Factors:   Sense of responsibility to family, Religious beliefs about death, Living with another person, especially a relative, Positive social support and Positive therapeutic relationship  Continued Clinical Symptoms:  Alcohol/Substance Abuse/Dependencies  Cognitive Features That Contribute To Risk:  Closed-mindedness    Suicide Risk:  Minimal: No identifiable suicidal ideation.  Patients presenting with no risk factors but with morbid ruminations; may be classified as minimal risk based on the severity of the depressive symptoms  Follow-up Information    [facility] Follow up.   Contact information: [address] [place] [address] [phone]           Plan Of Care/Follow-up recommendations:  Activity:  Activity as tolerated Diet:  Regular diet Other:  Follow-up with outpatient care  Alethia Berthold, MD 01/30/2019, 11:38 AM

## 2019-01-30 NOTE — BHH Suicide Risk Assessment (Signed)
[facility] INPATIENT:  Family/Significant Other Suicide Prevention Education  Suicide Prevention Education:  Patient Refusal for Family/Significant Other Suicide Prevention Education: The patient Jenna Hunt has refused to provide written consent for family/significant other to be provided Family/Significant Other Suicide Prevention Education during admission and/or prior to discharge.  Physician notified.  SPE completed with pt, as pt refused to consent to family contact. SPI pamphlet provided to pt and pt was encouraged to share information with support network, ask questions, and talk about any concerns relating to SPE. Pt denies access to guns/firearms and verbalized understanding of information provided. Mobile Crisis information also provided to pt.   Rozann Lesches 01/30/2019, 12:46 PM

## 2020-01-08 IMAGING — CT CT HEAD WITHOUT CONTRAST
3 series · 15 of 47 positions shown, 18 images · non-contrast
Comparison: None.

CLINICAL DATA: 22-year-old female with altered mental status,
aggressive behavior.

EXAM:
CT HEAD WITHOUT CONTRAST
TECHNIQUE: Contiguous axial images were obtained from the base of the skull
through the vertex without intravenous contrast.

[Series 2: head wo · axial · 0.47mm/px · z∈[+1584,+1709]mm · 9 of 30 slices shown, 12 images]
[im 3/30  brain]
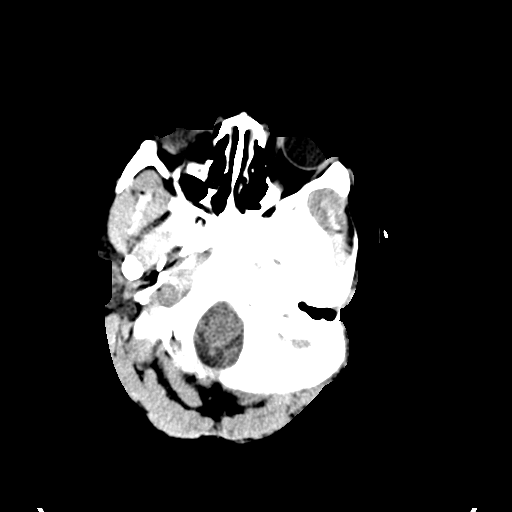
[im 3/30  bone]
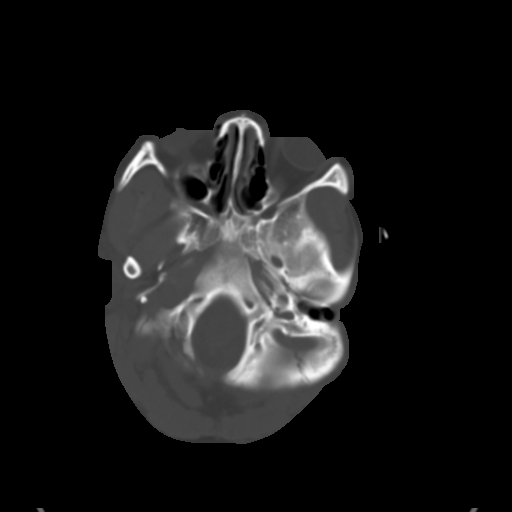
[im 6/30  brain]
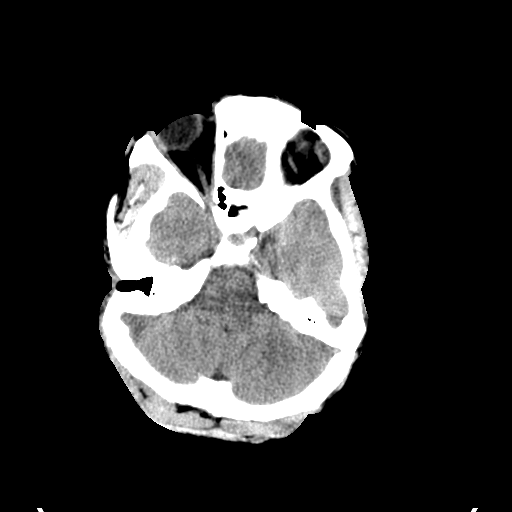
[im 9/30  brain]
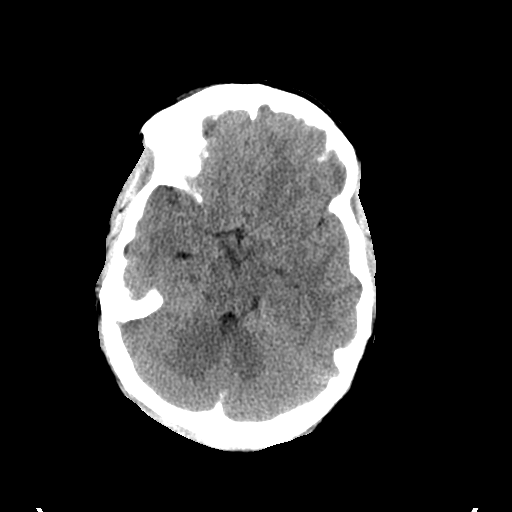
[im 12/30  brain]
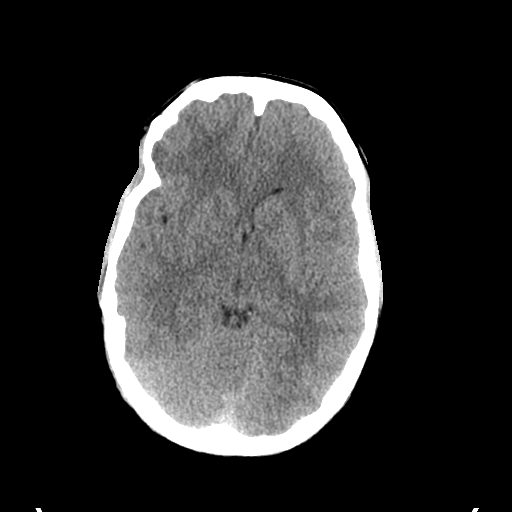
[im 16/30  brain]
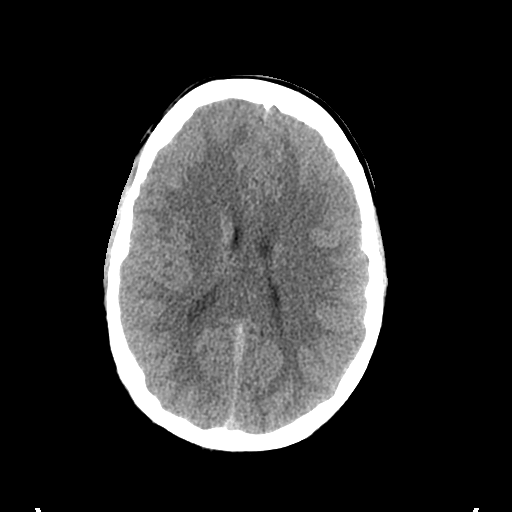
[im 16/30  bone]
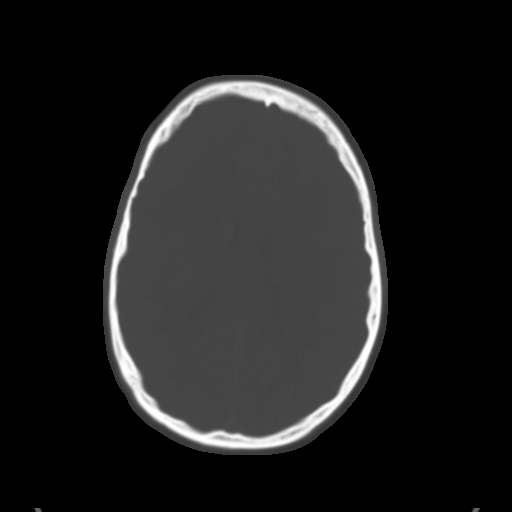
[im 19/30  brain]
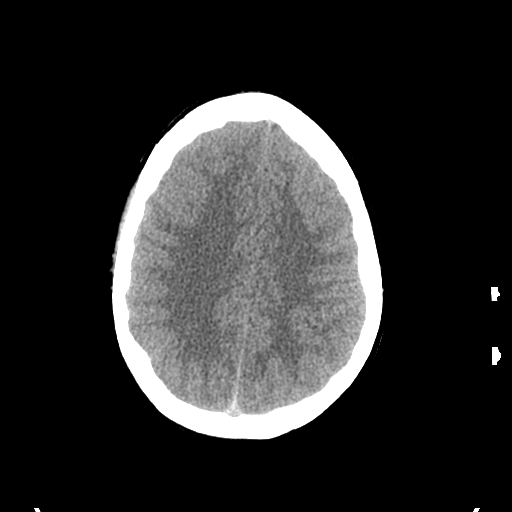
[im 22/30  brain]
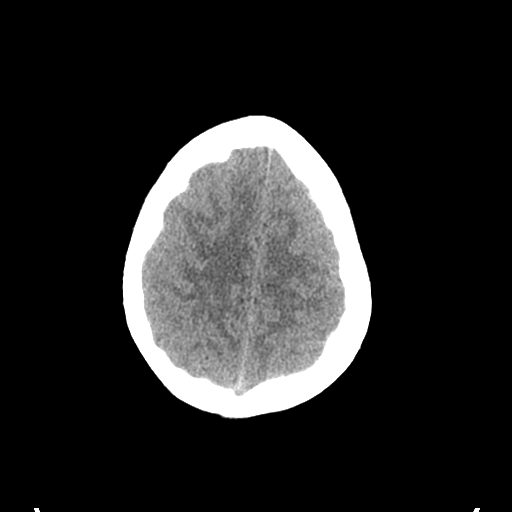
[im 25/30  brain]
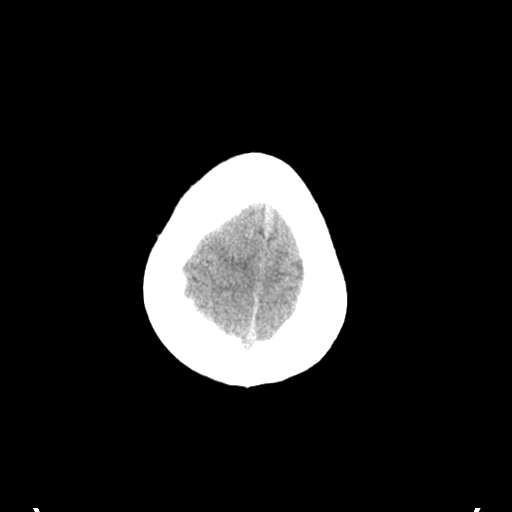
[im 28/30  brain]
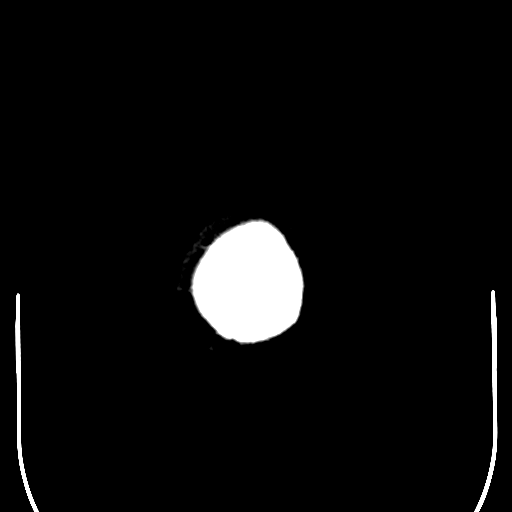
[im 28/30  bone]
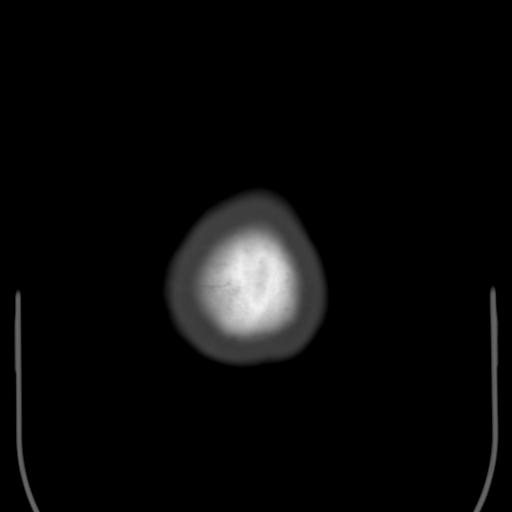

[Series 5: coronal soft tissue · coronal · 0.29mm/px · 3 of 84 slices shown]
[im 28/84  brain]
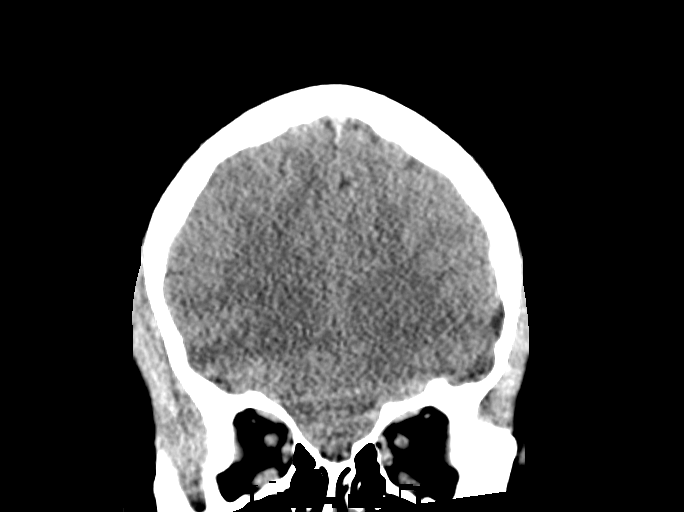
[im 37/84  brain]
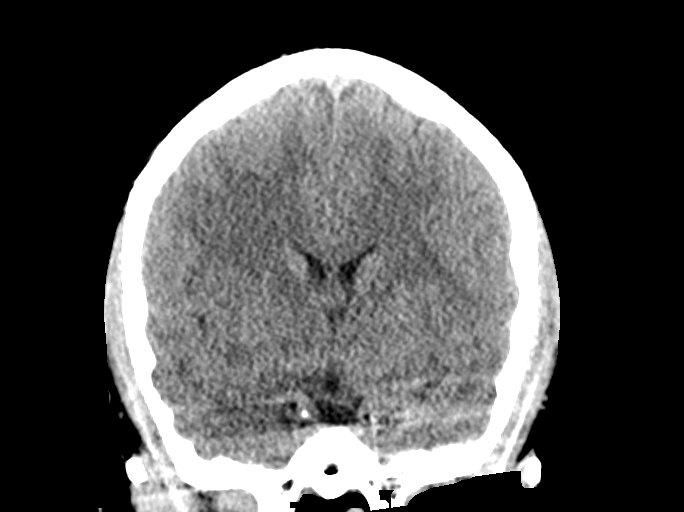
[im 47/84  brain]
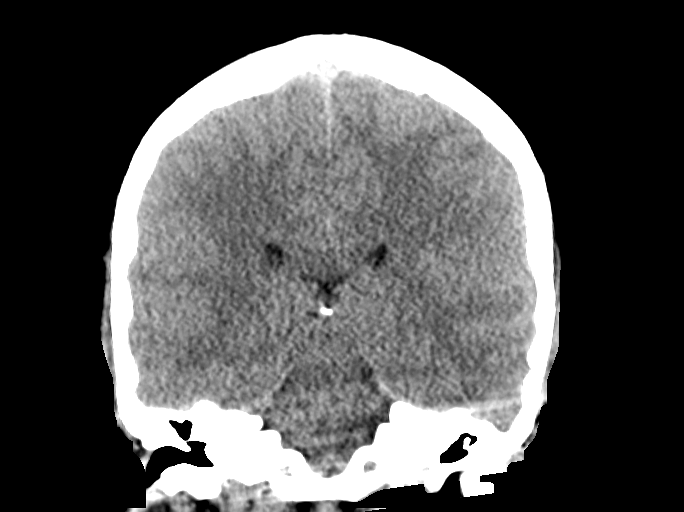

[Series 6: sagittal soft tissue · sagittal · 0.29mm/px · 3 of 59 slices shown]
[im 20/59  brain]
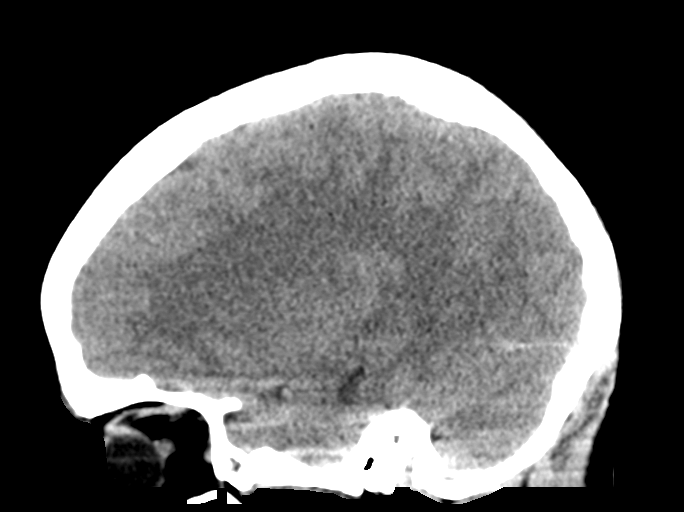
[im 30/59  brain]
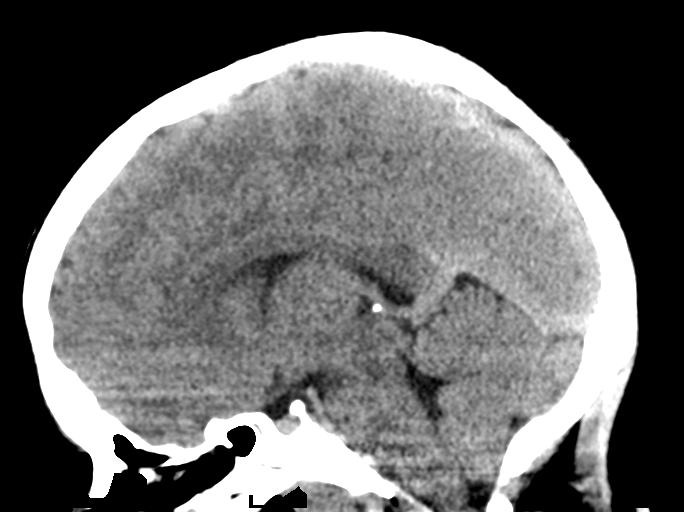
[im 39/59  brain]
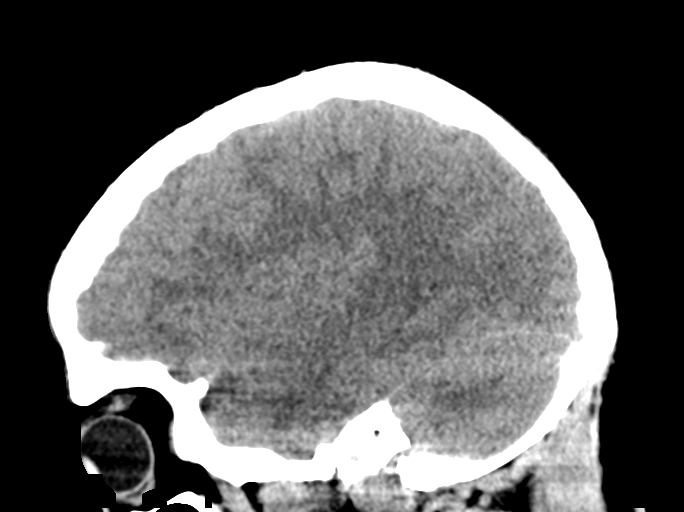

[15 of 47 positions shown; findings below may reference images not displayed]

FINDINGS: Brain: Normal cerebral volume. No midline shift, ventriculomegaly,
mass effect, evidence of mass lesion, intracranial hemorrhage or
evidence of cortically based acute infarction. Gray-white matter
differentiation is within normal limits throughout the brain.

Vascular: No suspicious intracranial vascular hyperdensity.

Skull: Negative.

Sinuses/Orbits: Visualized paranasal sinuses and mastoids are well
pneumatized.

Other: Visualized orbits and scalp soft tissues are within normal
limits.
IMPRESSION: Normal noncontrast CT appearance of the brain.

## 2024-06-18 ENCOUNTER — Emergency Department (HOSPITAL_COMMUNITY): Admission: EM | Admit: 2024-06-18 | Discharge: 2024-06-18 | Discharge status: Against Medical Advice

## 2024-06-18 ENCOUNTER — Encounter (HOSPITAL_COMMUNITY): Payer: Self-pay

## 2024-06-18 ENCOUNTER — Other Ambulatory Visit: Payer: Self-pay

## 2024-06-18 DIAGNOSIS — N939 Abnormal uterine and vaginal bleeding, unspecified: Secondary | ICD-10-CM | POA: Insufficient documentation

## 2024-06-18 DIAGNOSIS — Z5321 Procedure and treatment not carried out due to patient leaving prior to being seen by health care provider: Secondary | ICD-10-CM | POA: Diagnosis not present

## 2024-06-18 HISTORY — DX: Attention-deficit hyperactivity disorder, unspecified type: F90.9

## 2024-06-18 HISTORY — DX: Bipolar disorder, unspecified: F31.9

## 2024-06-18 HISTORY — DX: Insomnia, unspecified: G47.00

## 2024-06-18 LAB — CBC WITH DIFFERENTIAL/PLATELET
Abs Immature Granulocytes: 0.02 K/uL (ref 0.00–0.07)
Basophils Absolute: 0.1 K/uL (ref 0.0–0.1)
Basophils Relative: 1 %
Eosinophils Absolute: 0.3 K/uL (ref 0.0–0.5)
Eosinophils Relative: 6 %
HCT: 47.2 % — ABNORMAL HIGH (ref 36.0–46.0)
Hemoglobin: 15.8 g/dL — ABNORMAL HIGH (ref 12.0–15.0)
Immature Granulocytes: 0 %
Lymphocytes Relative: 37 %
Lymphs Abs: 1.9 K/uL (ref 0.7–4.0)
MCH: 28.5 pg (ref 26.0–34.0)
MCHC: 33.5 g/dL (ref 30.0–36.0)
MCV: 85 fL (ref 80.0–100.0)
Monocytes Absolute: 0.5 K/uL (ref 0.1–1.0)
Monocytes Relative: 10 %
Neutro Abs: 2.4 K/uL (ref 1.7–7.7)
Neutrophils Relative %: 46 %
Platelets: 313 K/uL (ref 150–400)
RBC: 5.55 MIL/uL — ABNORMAL HIGH (ref 3.87–5.11)
RDW: 13 % (ref 11.5–15.5)
WBC: 5.3 K/uL (ref 4.0–10.5)
nRBC: 0 % (ref 0.0–0.2)

## 2024-06-18 LAB — URINALYSIS, ROUTINE W REFLEX MICROSCOPIC
Bilirubin Urine: NEGATIVE
Glucose, UA: NEGATIVE mg/dL
Ketones, ur: NEGATIVE mg/dL
Nitrite: NEGATIVE
Protein, ur: NEGATIVE mg/dL
Specific Gravity, Urine: 1.013 (ref 1.005–1.030)
pH: 5 (ref 5.0–8.0)

## 2024-06-18 LAB — HCG, SERUM, QUALITATIVE: Preg, Serum: NEGATIVE

## 2024-06-18 NOTE — ED Notes (Signed)
 Pt called x3, no answer, pt being pulled OTF.

## 2024-06-18 NOTE — ED Triage Notes (Signed)
 Pt with hx of ovarian cyst is here for heavy vaginal bleeding since Sunday.  Pt states that she has been having to change tampons every 3 hours.

## 2024-06-18 NOTE — ED Notes (Signed)
 Pt called no answer

## 2024-06-18 NOTE — ED Notes (Signed)
 Pt called x's two.

## 2024-07-14 ENCOUNTER — Ambulatory Visit (HOSPITAL_COMMUNITY)
Admission: EM | Admit: 2024-07-14 | Discharge: 2024-07-15 | Disposition: A | Discharge status: Home / Self Care | Attending: Psychiatry | Admitting: Psychiatry

## 2024-07-14 DIAGNOSIS — Z79899 Other long term (current) drug therapy: Secondary | ICD-10-CM | POA: Diagnosis not present

## 2024-07-14 DIAGNOSIS — F319 Bipolar disorder, unspecified: Secondary | ICD-10-CM | POA: Insufficient documentation

## 2024-07-14 DIAGNOSIS — F1090 Alcohol use, unspecified, uncomplicated: Secondary | ICD-10-CM | POA: Insufficient documentation

## 2024-07-14 DIAGNOSIS — Z9151 Personal history of suicidal behavior: Secondary | ICD-10-CM | POA: Diagnosis present

## 2024-07-14 DIAGNOSIS — R45851 Suicidal ideations: Secondary | ICD-10-CM | POA: Insufficient documentation

## 2024-07-14 DIAGNOSIS — Z59811 Housing instability, housed, with risk of homelessness: Secondary | ICD-10-CM | POA: Diagnosis not present

## 2024-07-14 DIAGNOSIS — F909 Attention-deficit hyperactivity disorder, unspecified type: Secondary | ICD-10-CM | POA: Insufficient documentation

## 2024-07-14 DIAGNOSIS — F332 Major depressive disorder, recurrent severe without psychotic features: Secondary | ICD-10-CM | POA: Diagnosis present

## 2024-07-14 LAB — CBC WITH DIFFERENTIAL/PLATELET
Abs Immature Granulocytes: 0.02 K/uL (ref 0.00–0.07)
Basophils Absolute: 0.1 K/uL (ref 0.0–0.1)
Basophils Relative: 1 %
Eosinophils Absolute: 0.5 K/uL (ref 0.0–0.5)
Eosinophils Relative: 6 %
HCT: 45.7 % (ref 36.0–46.0)
Hemoglobin: 15.3 g/dL — ABNORMAL HIGH (ref 12.0–15.0)
Immature Granulocytes: 0 %
Lymphocytes Relative: 30 %
Lymphs Abs: 2.4 K/uL (ref 0.7–4.0)
MCH: 28.2 pg (ref 26.0–34.0)
MCHC: 33.5 g/dL (ref 30.0–36.0)
MCV: 84.2 fL (ref 80.0–100.0)
Monocytes Absolute: 0.7 K/uL (ref 0.1–1.0)
Monocytes Relative: 9 %
Neutro Abs: 4.3 K/uL (ref 1.7–7.7)
Neutrophils Relative %: 54 %
Platelets: 342 K/uL (ref 150–400)
RBC: 5.43 MIL/uL — ABNORMAL HIGH (ref 3.87–5.11)
RDW: 13.2 % (ref 11.5–15.5)
WBC: 8 K/uL (ref 4.0–10.5)
nRBC: 0 % (ref 0.0–0.2)

## 2024-07-14 LAB — POCT URINE DRUG SCREEN - MANUAL ENTRY (I-SCREEN)
POC Amphetamine UR: NOT DETECTED
POC Buprenorphine (BUP): NOT DETECTED
POC Cocaine UR: NOT DETECTED
POC Marijuana UR: POSITIVE — AB
POC Methadone UR: NOT DETECTED
POC Methamphetamine UR: NOT DETECTED
POC Morphine: NOT DETECTED
POC Oxazepam (BZO): NOT DETECTED
POC Oxycodone UR: NOT DETECTED
POC Secobarbital (BAR): NOT DETECTED

## 2024-07-14 LAB — COMPREHENSIVE METABOLIC PANEL WITH GFR
ALT: 11 U/L (ref 0–44)
AST: 21 U/L (ref 15–41)
Albumin: 4.2 g/dL (ref 3.5–5.0)
Alkaline Phosphatase: 70 U/L (ref 38–126)
Anion gap: 13 (ref 5–15)
BUN: 6 mg/dL (ref 6–20)
CO2: 22 mmol/L (ref 22–32)
Calcium: 9 mg/dL (ref 8.9–10.3)
Chloride: 100 mmol/L (ref 98–111)
Creatinine, Ser: 0.81 mg/dL (ref 0.44–1.00)
GFR, Estimated: >60 mL/min (ref >60)
Glucose, Bld: 67 mg/dL — ABNORMAL LOW (ref 70–99)
Potassium: 4.1 mmol/L (ref 3.5–5.1)
Sodium: 135 mmol/L (ref 135–145)
Total Bilirubin: 0.7 mg/dL (ref 0.0–1.2)
Total Protein: 7.7 g/dL (ref 6.5–8.1)

## 2024-07-14 LAB — MAGNESIUM: Magnesium: 1.9 mg/dL (ref 1.7–2.4)

## 2024-07-14 LAB — LIPID PANEL
Cholesterol: 166 mg/dL (ref 0–200)
HDL: 86 mg/dL (ref >40)
LDL Cholesterol: 73 mg/dL (ref 0–99)
Total CHOL/HDL Ratio: 1.9 ratio
Triglycerides: 36 mg/dL (ref <150)
VLDL: 7 mg/dL (ref 0–40)

## 2024-07-14 LAB — HEMOGLOBIN A1C
Hgb A1c MFr Bld: 5.2 % (ref 4.8–5.6)
Mean Plasma Glucose: 102.54 mg/dL

## 2024-07-14 LAB — ETHANOL: Alcohol, Ethyl (B): <15 mg/dL (ref <15)

## 2024-07-14 LAB — POC URINE PREG, ED: Preg Test, Ur: NEGATIVE

## 2024-07-14 LAB — POCT PREGNANCY, URINE: Preg Test, Ur: NEGATIVE

## 2024-07-14 LAB — TSH: TSH: 1.02 u[IU]/mL (ref 0.350–4.500)

## 2024-07-14 MED ORDER — ACETAMINOPHEN 325 MG PO TABS: 650.0000 mg | ORAL_TABLET | Freq: Four times a day (QID) | ORAL | Status: DC | PRN

## 2024-07-14 MED ORDER — TRAZODONE HCL 50 MG PO TABS
50.0000 mg | ORAL_TABLET | Freq: Every day | ORAL | Status: DC
  Administered 2024-07-14: 50 mg via ORAL
  Filled 2024-07-14: qty 1

## 2024-07-14 MED ORDER — LORAZEPAM 2 MG/ML IJ SOLN: 2.0000 mg | Freq: Three times a day (TID) | INTRAMUSCULAR | Status: DC | PRN

## 2024-07-14 MED ORDER — DIPHENHYDRAMINE HCL 50 MG/ML IJ SOLN: 50.0000 mg | Freq: Three times a day (TID) | INTRAMUSCULAR | Status: DC | PRN

## 2024-07-14 MED ORDER — DIPHENHYDRAMINE HCL 50 MG PO CAPS: 50.0000 mg | ORAL_CAPSULE | Freq: Three times a day (TID) | ORAL | Status: DC | PRN

## 2024-07-14 MED ORDER — ALUM & MAG HYDROXIDE-SIMETH 200-200-20 MG/5ML PO SUSP: 30.0000 mL | ORAL | Status: DC | PRN

## 2024-07-14 MED ORDER — MAGNESIUM HYDROXIDE 400 MG/5ML PO SUSP: 30.0000 mL | Freq: Every day | ORAL | Status: DC | PRN

## 2024-07-14 MED ORDER — HALOPERIDOL LACTATE 5 MG/ML IJ SOLN: 5.0000 mg | Freq: Three times a day (TID) | INTRAMUSCULAR | Status: DC | PRN

## 2024-07-14 MED ORDER — HALOPERIDOL 5 MG PO TABS: 5.0000 mg | ORAL_TABLET | Freq: Three times a day (TID) | ORAL | Status: DC | PRN

## 2024-07-14 MED ORDER — HYDROXYZINE HCL 25 MG PO TABS: 25.0000 mg | ORAL_TABLET | Freq: Three times a day (TID) | ORAL | Status: DC | PRN

## 2024-07-14 MED ORDER — HALOPERIDOL LACTATE 5 MG/ML IJ SOLN: 10.0000 mg | Freq: Three times a day (TID) | INTRAMUSCULAR | Status: DC | PRN

## 2024-07-14 MED ORDER — ARIPIPRAZOLE 5 MG PO TABS
5.0000 mg | ORAL_TABLET | Freq: Once | ORAL | Status: AC
  Administered 2024-07-14: 5 mg via ORAL
  Filled 2024-07-14: qty 1

## 2024-07-14 NOTE — ED Provider Notes (Signed)
 [facility] Continuous Assessment Admission H&P   Date: 07/14/24 Patient Name: Jenna Hunt MRN: [id number] Chief Complaint: Depression   Diagnoses:  Final diagnoses:  Bipolar depression [facility])    HPI: Jenna Hunt 28 y.o., female patient presented to [facility] as a voluntary walk in accompanied by her boyfriend with complaints of depression and recent passive SI. She is wanting to restart her psychiatric medications and follow up with a PHP or IOP program. Jenna Hunt, is seen face to face by this provider  and chart reviewed on 07/14/24.  Per chart review, pt has a PPHx of Bipolar depression, ADHD, anxiety and insomnia. Pt was previously prescribed Abilify  5mg  and Trazodone 50mg  at bedtime. Pt reports that these medications were very effective for stabilizing her mood and helping her sleep. Medical hx of scoliosis.   On evaluation Jenna Hunt reports that was having passive suicidal ideations yesterday after getting into an argument with her boyfriend. Pt currently denies SI and states that she was able to talk to he grandmother about everything last night which de-escalated her emotions. She reports prior suicidal gestures such as walking out in the street and inhaling a large amount of her albuterol inhaler 3 months ago. Pt denies current HI and AVH. She reports feeling stressed and misunderstood by her boyfriend. Depressive symptoms include decreased sleep, passive suicidal ideations, crying, worthlessness, irritability and anhedonia. Pt reports that she is able to fall asleep but wakes up for hours throughout the night. She reports drinking a few beers when she has the money to do so, last beer was 2 days ago. She denies having any withdrawal symptoms and does  not display any signs/symptoms of alcohol withdrawal. Discussed plan to keep pt overnight to restart Abilify  5mg  for mood and Trazodone 50mg  at bedtime for sleep with plan to discharge tomorrow with prescription and referral to  PHP/IOP program.   During evaluation Jenna Hunt is sitting up in assessment room,  in no acute distress.  She is alert & oriented x 4, calm, cooperative and attentive for this assessment.  Her mood is depressed with congruent affect.  She has normal speech, and behavior.  Objectively there is no evidence of psychosis/mania or delusional thinking. Pt does not appear to be responding to internal or external stimuli.  Patient is able to converse coherently, goal directed thoughts, no distractibility, or pre-occupation.  She denies current suicidal/self-harm/homicidal ideation, psychosis, and paranoia.  Patient answered assessment  question appropriately.     PHQ 2-9:   Flowsheet Row ED from 07/14/2024 in [facility] ED from 06/18/2024 in [facility] at [facility] Admission (Discharged) from 01/29/2019 in [facility] INPATIENT BEHAVIORAL MEDICINE  C-SSRS RISK CATEGORY Moderate Risk No Risk High Risk      Total Time spent with patient: 30 minutes  Musculoskeletal  Strength & Muscle Tone: within normal limits Gait & Station: normal Patient leans: N/A  Psychiatric Specialty Exam  Presentation General Appearance:  Casual  Eye Contact: Fair  Speech: Clear and Coherent  Speech Volume: Normal  Handedness: Right   Mood and Affect  Mood: Depressed  Affect: Congruent; Depressed   Thought Process  Thought Processes: Coherent  Descriptions of Associations:Intact  Orientation:Full (Time, Place and Person)  Thought Content:WDL  Diagnosis of Schizophrenia or Schizoaffective disorder in past: No   Hallucinations:Hallucinations: None  Ideas of Reference:None  Suicidal Thoughts:Suicidal Thoughts: No (SI yesterday, deneis today)  Homicidal Thoughts:Homicidal Thoughts: No   Sensorium  Memory: Recent Fair; Immediate Good  Judgment: Fair  Insight: Fair   Chartered certified accountant: Fair  Attention  Span: Fair  Recall: [employer] of Knowledge: Fair  Language: Fair   Psychomotor Activity  Psychomotor Activity: Psychomotor Activity: Normal   Assets  Assets: Communication Skills; Desire for Improvement; Financial Resources/Insurance; Physical Health; Resilience; Intimacy; Vocational/Educational; Housing   Sleep  Sleep: Sleep: Fair Number of Hours of Sleep: 5   Nutritional Assessment (For OBS and FBC admissions only) Has the patient had a weight loss or gain of 10 pounds or more in the last 3 months?: No Has the patient had a decrease in food intake/or appetite?: No Does the patient have dental problems?: No Does the patient have eating habits or behaviors that may be indicators of an eating disorder including binging or inducing vomiting?: No Has the patient recently lost weight without trying?: 0 Has the patient been eating poorly because of a decreased appetite?: 0 Malnutrition Screening Tool Score: 0    Physical Exam Vitals and nursing note reviewed.  Constitutional:      Appearance: Normal appearance.  HENT:     Head: Normocephalic.     Nose: Nose normal.  Eyes:     Extraocular Movements: Extraocular movements intact.  Cardiovascular:     Rate and Rhythm: Normal rate.  Pulmonary:     Effort: Pulmonary effort is normal.  Musculoskeletal:        General: Normal range of motion.     Cervical back: Normal range of motion.  Neurological:     General: No focal deficit present.     Mental Status: She is alert and oriented to person, place, and time.    Review of Systems  Constitutional: Negative.   HENT: Negative.    Eyes: Negative.   Respiratory: Negative.    Cardiovascular: Negative.   Gastrointestinal: Negative.   Genitourinary: Negative.   Musculoskeletal: Negative.   Neurological: Negative.   Endo/Heme/Allergies: Negative.   Psychiatric/Behavioral:  Positive for depression.     Blood pressure 126/64, pulse 73, temperature 98.6 F (37 C),  temperature source Oral, resp. rate 20, last menstrual period 06/18/2024, SpO2 99%. There is no height or weight on file to calculate BMI.  Past Psychiatric History:  Past Medical History:  Diagnosis Date   ADHD    Anxiety 12/18/2018   Patient states she has anxiety, unable to verify.   Bipolar depression (HCC)    Insomnia      Is the patient at risk to self? Yes  Has the patient been a risk to self in the past 6 months? Yes .    Has the patient been a risk to self within the distant past? No   Is the patient a risk to others? No   Has the patient been a risk to others in the past 6 months? No   Has the patient been a risk to others within the distant past? No   Past Medical History: Scoliosis, chronic back pain  Family History: None reported   Social History: Pt currently employed and living with boyfriend's grandmother, as pt is being evicted from home in 2 weeks. She reports drinking beers 4 days a week. Criminal hx of larceny and was in jail in 2018.   Last Labs:  Admission on 07/14/2024  Component Date Value Ref Range Status   WBC 07/14/2024 8.0  4.0 - 10.5 K/uL Final   RBC 07/14/2024 5.43 (H)  3.87 - 5.11 MIL/uL Final  Hemoglobin 07/14/2024 15.3 (H)  12.0 - 15.0 g/dL Final   HCT 89/87/7974 45.7  36.0 - 46.0 % Final   MCV 07/14/2024 84.2  80.0 - 100.0 fL Final   MCH 07/14/2024 28.2  26.0 - 34.0 pg Final   MCHC 07/14/2024 33.5  30.0 - 36.0 g/dL Final   RDW 89/87/7974 13.2  11.5 - 15.5 % Final   Platelets 07/14/2024 342  150 - 400 K/uL Final   nRBC 07/14/2024 0.0  0.0 - 0.2 % Final   Neutrophils Relative % 07/14/2024 54  % Final   Neutro Abs 07/14/2024 4.3  1.7 - 7.7 K/uL Final   Lymphocytes Relative 07/14/2024 30  % Final   Lymphs Abs 07/14/2024 2.4  0.7 - 4.0 K/uL Final   Monocytes Relative 07/14/2024 9  % Final   Monocytes Absolute 07/14/2024 0.7  0.1 - 1.0 K/uL Final   Eosinophils Relative 07/14/2024 6  % Final   Eosinophils Absolute 07/14/2024 0.5  0.0 - 0.5 K/uL  Final   Basophils Relative 07/14/2024 1  % Final   Basophils Absolute 07/14/2024 0.1  0.0 - 0.1 K/uL Final   Immature Granulocytes 07/14/2024 0  % Final   Abs Immature Granulocytes 07/14/2024 0.02  0.00 - 0.07 K/uL Final   Performed at [facility], [address], [place], [place] [address]   Sodium 07/14/2024 135  135 - 145 mmol/L Final   Potassium 07/14/2024 4.1  3.5 - 5.1 mmol/L Final   Chloride 07/14/2024 100  98 - 111 mmol/L Final   CO2 07/14/2024 22  22 - 32 mmol/L Final   Glucose, Bld 07/14/2024 67 (L)  70 - 99 mg/dL Final   Glucose reference range applies only to samples taken after fasting for at least 8 hours.   BUN 07/14/2024 6  6 - 20 mg/dL Final   Creatinine, Ser 07/14/2024 0.81  0.44 - 1.00 mg/dL Final   Calcium 89/87/7974 9.0  8.9 - 10.3 mg/dL Final   Total Protein 89/87/7974 7.7  6.5 - 8.1 g/dL Final   Albumin 89/87/7974 4.2  3.5 - 5.0 g/dL Final   AST 89/87/7974 21  15 - 41 U/L Final   ALT 07/14/2024 11  0 - 44 U/L Final   Alkaline Phosphatase 07/14/2024 70  38 - 126 U/L Final   Total Bilirubin 07/14/2024 0.7  0.0 - 1.2 mg/dL Final   GFR, Estimated 07/14/2024 >60  >60 mL/min Final   Comment: (NOTE) Calculated using the CKD-EPI Creatinine Equation (2021)    Anion gap 07/14/2024 13  5 - 15 Final   Performed at [facility], [address] [place], [place] [address]   Hgb A1c MFr Bld 07/14/2024 5.2  4.8 - 5.6 % Final   Comment: (NOTE) Diagnosis of Diabetes The following HbA1c ranges recommended by the American Diabetes Association ([place]) may be used as an aid in the diagnosis of diabetes mellitus.  Hemoglobin             Suggested A1C NGSP%              Diagnosis  <5.7                   Non Diabetic  5.7-6.4                Pre-Diabetic  >6.4                   Diabetic  <7.0  Glycemic control for                       adults with diabetes.     Mean Plasma Glucose 07/14/2024 102.54  mg/dL Final   Performed at [facility], [address] [place], [place] [address]   Alcohol, Ethyl (B) 07/14/2024 <15  <15 mg/dL Final   Comment: (NOTE) For medical purposes only. Performed at [facility], [address] [place], [place] [address]    Magnesium  07/14/2024 1.9  1.7 - 2.4 mg/dL Final   Performed at [facility], [address], [place], [place] [address]   Preg Test, Ur 07/14/2024 Negative  Negative Final   POC Amphetamine UR 07/14/2024 None Detected  NONE DETECTED (Cut Off Level 1000 ng/mL) Final   POC Secobarbital (BAR) 07/14/2024 None Detected  NONE DETECTED (Cut Off Level 300 ng/mL) Final   POC Buprenorphine (BUP) 07/14/2024 None Detected  NONE DETECTED (Cut Off Level 10 ng/mL) Final   POC Oxazepam (BZO) 07/14/2024 None Detected  NONE DETECTED (Cut Off Level 300 ng/mL) Final   POC Cocaine UR 07/14/2024 None Detected  NONE DETECTED (Cut Off Level 300 ng/mL) Final   POC Methamphetamine UR 07/14/2024 None Detected  NONE DETECTED (Cut Off Level 1000 ng/mL) Final   POC Morphine 07/14/2024 None Detected  NONE DETECTED (Cut Off Level 300 ng/mL) Final   POC Methadone UR 07/14/2024 None Detected  NONE DETECTED (Cut Off Level 300 ng/mL) Final   POC Oxycodone UR 07/14/2024 None Detected  NONE DETECTED (Cut Off Level 100 ng/mL) Final   POC Marijuana UR 07/14/2024 Positive (A)  NONE DETECTED (Cut Off Level 50 ng/mL) Final   Preg Test, Ur 07/14/2024 NEGATIVE  NEGATIVE Final   Comment:        THE SENSITIVITY OF THIS METHODOLOGY IS >20 mIU/mL.   Admission on 06/18/2024, Discharged on 06/18/2024  Component Date Value Ref Range Status   WBC 06/18/2024 5.3  4.0 - 10.5 K/uL Final   RBC 06/18/2024 5.55 (H)  3.87 - 5.11 MIL/uL Final   Hemoglobin 06/18/2024 15.8 (H)  12.0 - 15.0 g/dL Final   HCT 90/83/7974 47.2 (H)  36.0 - 46.0 % Final   MCV 06/18/2024 85.0  80.0 - 100.0 fL Final   MCH 06/18/2024 28.5  26.0 - 34.0 pg Final   MCHC 06/18/2024 33.5  30.0 - 36.0 g/dL Final   RDW 90/83/7974 13.0  11.5 - 15.5  % Final   Platelets 06/18/2024 313  150 - 400 K/uL Final   nRBC 06/18/2024 0.0  0.0 - 0.2 % Final   Neutrophils Relative % 06/18/2024 46  % Final   Neutro Abs 06/18/2024 2.4  1.7 - 7.7 K/uL Final   Lymphocytes Relative 06/18/2024 37  % Final   Lymphs Abs 06/18/2024 1.9  0.7 - 4.0 K/uL Final   Monocytes Relative 06/18/2024 10  % Final   Monocytes Absolute 06/18/2024 0.5  0.1 - 1.0 K/uL Final   Eosinophils Relative 06/18/2024 6  % Final   Eosinophils Absolute 06/18/2024 0.3  0.0 - 0.5 K/uL Final   Basophils Relative 06/18/2024 1  % Final   Basophils Absolute 06/18/2024 0.1  0.0 - 0.1 K/uL Final   Immature Granulocytes 06/18/2024 0  % Final   Abs Immature Granulocytes 06/18/2024 0.02  0.00 - 0.07 K/uL Final   Performed at [facility], [address], [place], [place] [address]   Color, Urine 06/18/2024 YELLOW  YELLOW Final   APPearance 06/18/2024 HAZY (A)  CLEAR Final   Specific Gravity, Urine 06/18/2024 1.013  1.005 - 1.030 Final   pH 06/18/2024 5.0  5.0 - 8.0 Final   Glucose, UA 06/18/2024 NEGATIVE  NEGATIVE mg/dL Final   Hgb urine dipstick 06/18/2024 MODERATE (A)  NEGATIVE Final   Bilirubin Urine 06/18/2024 NEGATIVE  NEGATIVE Final   Ketones, ur 06/18/2024 NEGATIVE  NEGATIVE mg/dL Final   Protein, ur 90/83/7974 NEGATIVE  NEGATIVE mg/dL Final   Nitrite 90/83/7974 NEGATIVE  NEGATIVE Final   Leukocytes,Ua 06/18/2024 SMALL (A)  NEGATIVE Final   RBC / HPF 06/18/2024 0-5  0 - 5 RBC/hpf Final   WBC, UA 06/18/2024 0-5  0 - 5 WBC/hpf Final   Bacteria, UA 06/18/2024 RARE (A)  NONE SEEN Final   Squamous Epithelial / HPF 06/18/2024 0-5  0 - 5 /HPF Final   Mucus 06/18/2024 PRESENT   Final   Performed at [facility], [address] [place], [place] [address]   Preg, Serum 06/18/2024 NEGATIVE  NEGATIVE Final   Comment:        THE SENSITIVITY OF THIS METHODOLOGY IS >10 mIU/mL. Performed at [facility], [address] [place], [place] [address]     Allergies: Lactose  intolerance (gi)  Medications:  Facility Ordered Medications  Medication   acetaminophen  (TYLENOL ) tablet 650 mg   alum & mag hydroxide-simeth (MAALOX/MYLANTA) 200-200-20 MG/5ML suspension 30 mL   magnesium  hydroxide (MILK OF MAGNESIA) suspension 30 mL   haloperidol (HALDOL) tablet 5 mg   And   diphenhydrAMINE (BENADRYL) capsule 50 mg   haloperidol lactate (HALDOL) injection 5 mg   And   diphenhydrAMINE (BENADRYL) injection 50 mg   And   LORazepam  (ATIVAN ) injection 2 mg   haloperidol lactate (HALDOL) injection 10 mg   And   diphenhydrAMINE (BENADRYL) injection 50 mg   And   LORazepam  (ATIVAN ) injection 2 mg   hydrOXYzine  (ATARAX ) tablet 25 mg   traZODone (DESYREL) tablet 50 mg   [COMPLETED] ARIPiprazole  (ABILIFY ) tablet 5 mg     Medical Decision Making  Based on evaluation, patient's suicidal ideation appears to be situational, in the context of psychosocial stressors including pending eviction, financial issues and argument with boyfriend. Pt is able to contract for safety and states that her boyfriend's grandmother is very supportive of her mental health treatment. She denies current suicidal ideations and wants to work on coping skills to avoid suicidal ideations when feeling overwhelmed. She is interested in starting a PHP or IOP program for more intensive/ frequent therapy sessions and ongoing medication management.    Treatment plan: - Admit to continuous assessment to monitor and restart psychotropic mediations.  - Plan to discharge tmrw with referrals for PHP or IOP programs.  - Restart Abilify  5mg  daily for mood stabilization.  - Restart Trazodone 50mg  at bedtime insomnia.  - Initiate agitation protocol per policy.  - Labs, EKG, UDS and UPT ordered.  -UDS positive for THC, all other results unremarkable.   Recommendations  Based on my evaluation the patient does not appear to have an emergency medical condition.  Alan JAYSON Mcardle, NP 07/14/24  12:14 PM

## 2024-07-14 NOTE — ED Notes (Signed)
 Pt provided lunch.

## 2024-07-14 NOTE — ED Notes (Signed)
 Labs sent one yellow tube, one red tube, one purple tube,(that's all I could get), two light green tubes, one dark green tube, patient's name is on the tubes and the patient's Order Requisition there also.

## 2024-07-14 NOTE — ED Notes (Signed)
 Pt resting in recliner, sleeping, respirations unlabored, no signs of distress. safety precautions in place.

## 2024-07-14 NOTE — Progress Notes (Signed)
   07/14/24 0847  [facility] Triage Screening (Walk-ins at [facility] only)  How Did You Hear About Us ? Self  What Is the Reason for Your Visit/Call Today? Jenna Hunt is a 28 year old female presenting to [facility] unaccomanied. Pt states she is endorsing suicidal thoughts at this time. Pt mentions that she has had 3 past suicide attempts in the Pt states she has a hx of the following diagnoses: Bipolar, Depression, Insomnia and ADHD. Pt states she has been drinking daily for the past 3 years. Pt states she drank a large beer 48 hours ago. Pt states, I drink to numb the pain I am going through. This Clinical research associate asked if the pt would try to hurt/end her life if she were to leave today, the pt responded saying, it depends if my boyfriend and I get into an argument. Pt denies substance use in the past 24 hours, Hi and AVH. Pts appearance is disheveled, motor activity is normal, affect is full, depressed mood, speech is normal, eye contact is normal. Pt states she is open to inpatient treatment and is wanting to get back on her medication. Pt states, I have been off my medication for about 3 years and need to get my life together.  How Long Has This Been Causing You Problems? <Week  Have You Recently Had Any Thoughts About Hurting Yourself? Yes  How long ago did you have thoughts about hurting yourself? yesterday  Are You Planning to Commit Suicide/Harm Yourself At This time? No  Have you Recently Had Thoughts About Hurting Someone Sherral? No  Are You Planning To Harm Someone At This Time? No  Physical Abuse Denies  Verbal Abuse Denies  Sexual Abuse Denies  Exploitation of patient/patient's resources Denies  Self-Neglect Denies  Possible abuse reported to: Other (Comment)  Are you currently experiencing any auditory, visual or other hallucinations? No  Have You Used Any Alcohol or Drugs in the Past 24 Hours? No  Do you have any current medical co-morbidities that require immediate attention? No  What Do You Feel Would  Help You the Most Today? Medication(s);Treatment for Depression or other mood problem  If access to [facility] was not available, would you have sought care in the Emergency Department? No  Determination of Need Urgent (48 hours)  Options For Referral Intensive Outpatient Therapy;Facility-Based Crisis  Determination of Need filed? Yes
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
## 2024-07-14 NOTE — ED Notes (Signed)
 Dinner provided.

## 2024-07-14 NOTE — ED Notes (Signed)
 Patient appears disheveled. Motor activity within normal limits. Speech normal in rate and tone. Affect full; mood depressed. Eye contact appropriate. Thought content notable for suicidal ideation; denies homicidal ideation (HI) and auditory/visual hallucinations (AVH).Currently endorsing suicidal ideation with no intent. No current plan disclosed. Pt states I'm just tired, everything is tiring. Patient remains under observation for safety. Skin intact, pt environtment secured, safety precautions in place.

## 2024-07-14 NOTE — ED Notes (Signed)
 D: Pt sitting on recliner.  Interaction with staff is appropriate.  Patient requesting HS Trazodone.  Writer informed patient medication scheduled for 2200.  Patient states understanding.  No distress noted. A: Continue Q 15 min checks for safety. R: Pt remains safe on the unit.

## 2024-07-15 MED ORDER — ARIPIPRAZOLE 5 MG PO TABS
5.0000 mg | ORAL_TABLET | Freq: Every day | ORAL | Status: DC
  Administered 2024-07-15: 5 mg via ORAL
  Filled 2024-07-15: qty 1

## 2024-07-15 MED ORDER — ARIPIPRAZOLE 5 MG PO TABS: 5.0000 mg | ORAL_TABLET | Freq: Every day | ORAL | 0 refills | Status: DC

## 2024-07-15 MED ORDER — TRAZODONE HCL 50 MG PO TABS: 50.0000 mg | ORAL_TABLET | Freq: Every day | ORAL | 0 refills | Status: DC

## 2024-07-15 NOTE — Discharge Instructions (Addendum)
  Based on the information that you have provided and the presenting issues outpatient services and resources for have been recommended.  It is imperative that you follow through with treatment recommendations within 5-7 days from the of discharge to mitigate further risk to your safety and mental well-being. A list of referrals has been provided below to get you started.  You are not limited to the list provided.  In case of an urgent crisis, you may contact the Mobile Crisis Unit with [facility] at [phone].                  Family Therapy Resources     [facility] [address] [place], [place], [address] [phone] phone [phone] phone ([address], Anthem/Elevance, [address], Centivo, [address], Calpine Corporation, Healthy Hydro, [place], [place], [address], ConocoPhillips, [place], UHC, American Financial, [place], Out of Network)  [facility], [place] [address] [place], [place], [address] [phone] phone ([place], Anthem/Elevance, Sanmina-SCI Options/Carelon, BCBS, [address], [place], [place], [place], [place], Harrah's Entertainment, [place], Calypso, [place], [facility])  [facility] [address] [place], [place], [address] [phone] phone (Medicaid, ask about other insurance)  [facility] [address], [address] [place], [place], [address] [phone] phone [phone] fax ([address], [place] , [place], [place], [place] Health Choice, UHC, General Electric, Self-Pay)  Sarah Lempka [address] [place], [place], [address] [phone] phone ([address] Anthem/Elevance, [address], [address], [place], CSX Corporation, [place], [place], [place], Harrah's Entertainment, [place], [place], Home, Mid - [facility])  [facility] - 6-8 MONTH WAIT FOR THERAPY; SOONER FOR MEDICATION MANAGEMENT [address], [address] [place], [place], [address] [phone]  phone ([address] AmeriHealth [address] - [place], [address], [place], [place], Friday Health Plans, [address], BCBS Healthy [place], [place], [address], [place], [place], [place], [place], Tricare, UHC, Safeco Corporation, [place])  [facility] [address] [place], [place], [address] [phone] phone  [facility] [address], [address] [place], [place], [address] [phone] phone  [facility] [address] [place], [place], [address] [phone] phone  [facility] - THERAPY ONLY [address]  [place], [place], [address] [phone] phone  [facility], [place] [address], [place], [address] [phone] phone  [facility] [address] [place], [place], [address] [phone] phone [phone] fax  [facility] [address] [place], [place], [address] [phone] phone [phone] fax  [facility] [phone] [address] [place], [place], [address] [phone] phone  Janit Griffins [address] [place], [place], [address]  [phone] phone  [facility]  (Adults Only) [address] [place], [place], [address]  [phone] phone [phone] fax

## 2024-07-15 NOTE — ED Notes (Signed)
D: Pt resting, eyes closed, respirations even and unlabored. No distress noted. A: Continue Q 15 min checks for safety. R: Pt remains safe on the unit.   

## 2024-07-15 NOTE — ED Notes (Signed)
.  dnn

## 2024-07-15 NOTE — ED Notes (Signed)
 Discharge instructions reviewed w/ pt. Medications, follow-up care and resources reviewed. Pt verbalized understanding. All belongings returned to pt. Pt A&Ox4, ambulatory w/ steady gait and VSS upon departure.
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
## 2024-07-15 NOTE — ED Notes (Signed)
 Pt A&Ox4, calm & cooperative and in NAD at this time. Denies SI/HI/AVH. Contracts for safety. Encouragement and support given. Will continue to monitor.

## 2024-07-15 NOTE — ED Notes (Signed)
 Pt sleeping at this time. Rise and fall of chest noted. Pt in NAD at this time. Will continue to monitor.

## 2024-07-15 NOTE — ED Notes (Signed)
Pt is watching TV 

## 2024-07-22 ENCOUNTER — Ambulatory Visit: Payer: Self-pay

## 2024-07-22 ENCOUNTER — Encounter (HOSPITAL_COMMUNITY): Payer: Self-pay | Admitting: Psychiatry

## 2024-07-22 ENCOUNTER — Ambulatory Visit (INDEPENDENT_AMBULATORY_CARE_PROVIDER_SITE_OTHER): Admitting: Psychiatry

## 2024-07-22 VITALS — BP 118/68 | HR 79 | Temp 99.1°F | Ht 61.0 in | Wt 129.0 lb

## 2024-07-22 DIAGNOSIS — G8929 Other chronic pain: Secondary | ICD-10-CM | POA: Diagnosis not present

## 2024-07-22 DIAGNOSIS — M545 Low back pain, unspecified: Secondary | ICD-10-CM

## 2024-07-22 DIAGNOSIS — F4310 Post-traumatic stress disorder, unspecified: Secondary | ICD-10-CM | POA: Diagnosis not present

## 2024-07-22 DIAGNOSIS — F1014 Alcohol abuse with alcohol-induced mood disorder: Secondary | ICD-10-CM

## 2024-07-22 MED ORDER — ARIPIPRAZOLE 10 MG PO TABS: 5.0000 mg | ORAL_TABLET | Freq: Every day | ORAL | 3 refills | Status: DC

## 2024-07-22 MED ORDER — TRAZODONE HCL 50 MG PO TABS: 50.0000 mg | ORAL_TABLET | Freq: Every day | ORAL | 3 refills | Status: DC

## 2024-07-22 NOTE — Telephone Encounter (Signed)
 FYI Only or Action Required?: FYI only for provider.  Patient was last seen in primary care on New Patient.  Called Nurse Triage reporting Back Pain.  Symptoms began a week ago.  Interventions attempted: OTC medications: Tylenol .  Symptoms are: unchanged.  Triage Disposition: See PCP When Office is Open (Within 3 Days)  Patient/caregiver understands and will follow disposition?: Yes  **New patient appt. Scheduled for 11/07/24. **          Copied from CRM [id number]. Topic: Clinical - Red Word Triage >> Jul 22, 2024 11:52 AM Jenna Hunt wrote: Red Word that prompted transfer to Nurse Triage: Pt having back and ankle pain. Warm transfer to NT. Reason for Disposition  [1] MODERATE back pain (e.g., interferes with normal activities) AND [2] present > 3 days  Answer Assessment - Initial Assessment Questions 1. ONSET: When did the pain begin? (e.g., minutes, hours, days)     X 1 week  2. LOCATION: Where does it hurt? (upper, mid or lower back)     Lower back  3. SEVERITY: How bad is the pain?  (e.g., Scale 1-10; mild, moderate, or severe)     6/10   4. PATTERN: Is the pain constant? (e.g., yes, no; constant, intermittent)      Constant   5. RADIATION: Does the pain shoot into your legs or somewhere else?     Right ankle pain, no redness or swelling currently, patient can walk and bare weight   6. CAUSE:  What do you think is causing the back pain?      Unsure  7. BACK OVERUSE:  Any recent lifting of heavy objects, strenuous work or exercise?     Lifting heavy objects at work, lifting boxes.   8. MEDICINES: What have you taken so far for the pain? (e.g., nothing, acetaminophen , NSAIDS)     Tylenol    9. NEUROLOGIC SYMPTOMS: Do you have any weakness, numbness, or problems with bowel/bladder control?     No   10. OTHER SYMPTOMS: Do you have any other symptoms? (e.g., fever, abdomen pain, burning with urination, blood in urine)       No   11.  PREGNANCY: Is there any chance you are pregnant? When was your last menstrual period? No , last menstruation was Oct. 2025.  New patient appt. Scheduled for 11/07/24. Referred to UC for present symptoms.  Protocols used: Back Pain-A-AH
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
## 2024-09-24 ENCOUNTER — Encounter (HOSPITAL_COMMUNITY): Payer: Self-pay

## 2024-09-24 ENCOUNTER — Encounter (HOSPITAL_COMMUNITY): Payer: Self-pay | Admitting: Psychiatry

## 2024-09-24 ENCOUNTER — Telehealth (INDEPENDENT_AMBULATORY_CARE_PROVIDER_SITE_OTHER): Admitting: Psychiatry

## 2024-09-24 DIAGNOSIS — F1014 Alcohol abuse with alcohol-induced mood disorder: Secondary | ICD-10-CM

## 2024-09-24 DIAGNOSIS — F4310 Post-traumatic stress disorder, unspecified: Secondary | ICD-10-CM

## 2024-09-24 MED ORDER — TRAZODONE HCL 100 MG PO TABS: 100.0000 mg | ORAL_TABLET | Freq: Every day | ORAL | 4 refills | Status: AC

## 2024-09-24 MED ORDER — ARIPIPRAZOLE 10 MG PO TABS: 5.0000 mg | ORAL_TABLET | Freq: Every day | ORAL | 4 refills | Status: AC

## 2024-09-24 NOTE — Progress Notes (Signed)
 BH MD/PA/NP OP Progress Note Virtual Visit via Video Note  I connected with Jenna Hunt on 09/24/2024 at  2:30 PM EST by a video enabled telemedicine application and verified that I am speaking with the correct person using two identifiers.  Location: Patient: Home Provider: Clinic   I discussed the limitations of evaluation and management by telemedicine and the availability of in person appointments. The patient expressed understanding and agreed to proceed.  I provided 30 minutes of non-face-to-face time during this encounter.    09/24/2024 12:35 PM Jenna Hunt  MRN:  [id number]  Chief Complaint: Can we increase my sleep medications  HPI: 28 year old female seen today for follow up psychiatric evaluation. She has a psychiatric history of alcohol dependence, cocaine use, bipolar disorder, substance induced mood disorder . She is currently managed on Abilify  10 mg daily and trazodone  50 mg nightly as needed. She notes that her medications are somewhat effective in managing her psychiatric conditions.   Today she is well-groomed, pleasant, cooperative, and engaged in conversation.  Patient informed writer that she would like to increase her trazodone .  She notes that she wakes up in the middle of the night and has to take a second one.  She does note that when she takes 100 mg trazodone  she sleeps well for 8 hours.  Since her last visit she informed writer that her  anxiety and is well-managed.  Today provider conducted a GAD- 7 and patient scored  a 5, at her last visit she scored 5.  Provider also conducted PHQ-9 and patient scored an 11, at her last visit she scored a 16.  She endorses increased appetite and notes that she has gained 5 pounds since her last visit.  Today she denies SI/HI/VAH, mania, paranoia.   Patient continues to live with her boyfriend and finds him supportive.     Today she is agreeable to increase trazodone  50 mg to 100 mg to help manage sleep. She will  continue all her other medications as prescribed.  Patient given resources to other mental health providers as her insurance is not covered at [facility]. No other concerns noted at this time.  Visit Diagnosis:    ICD-10-CM   1. Alcohol abuse with alcohol-induced mood disorder (HCC)  F10.14 ARIPiprazole  (ABILIFY ) 10 MG tablet    2. PTSD (post-traumatic stress disorder)  F43.10 traZODone  (DESYREL ) 100 MG tablet      Past Psychiatric History: Alcohol dependence, cocaine use, bipolar disorder, substance induced mood disorder   Past Medical History:  Past Medical History:  Diagnosis Date   ADHD    Anxiety 12/18/2018   Patient states she has anxiety, unable to verify.   Bipolar depression (HCC)    Insomnia    No past surgical history on file.  Family Psychiatric History:  Mother bipolar, father schizophrenia   Family History: No family history on file.  Social History:  Social History   Socioeconomic History   Marital status: Single    Spouse name: Not on file   Number of children: Not on file   Years of education: Not on file   Highest education level: Not on file  Occupational History   Not on file  Tobacco Use   Smoking status: Some Days    Current packs/day: 0.10    Average packs/day: 0.1 packs/day for 10.0 years (1.0 ttl pk-yrs)    Types: Cigarettes   Smokeless tobacco: Never  Vaping Use   Vaping status: Never Used  Substance  and Sexual Activity   Alcohol use: Yes    Comment: social drinker   Drug use: Not Currently    Comment: CBD oil now.    Sexual activity: Yes    Birth control/protection: None    Comment: Morning patient states she takes Morning after pill  every two weeks or when patient has mone to buy them. 2018/12/18  Other Topics Concern   Not on file  Social History Narrative   Not on file   Social Drivers of Health   Tobacco Use: High Risk (07/22/2024)   Patient History    Smoking Tobacco Use: Some Days    Smokeless Tobacco Use: Never    Passive  Exposure: Not on file  Financial Resource Strain: High Risk (04/15/2024)   Received from [facility]   Overall Financial Resource Strain (CARDIA)    How hard is it for you to pay for the very basics like food, housing, medical care, and heating?: Very hard  Food Insecurity: No Food Insecurity (07/14/2024)   Epic    Worried About Programme Researcher, Broadcasting/film/video in the Last Year: Never true    Ran Out of Food in the Last Year: Never true  Recent Concern: Food Insecurity - Food Insecurity Present (04/15/2024)   Received from [facility]   Epic    Within the past 12 months, you worried that your food would run out before you got the money to buy more.: Often true    Within the past 12 months, the food you bought just didn't last and you didn't have money to get more.: Often true  Transportation Needs: No Transportation Needs (07/14/2024)   Epic    Lack of Transportation (Medical): No    Lack of Transportation (Non-Medical): No  Recent Concern: Transportation Needs - Unmet Transportation Needs (04/15/2024)   Received from [facility]    In the past 12 months, has lack of transportation kept you from medical appointments or from getting medications?: Yes    In the past 12 months, has lack of transportation kept you from meetings, work, or from getting things needed for daily living?: Yes  Physical Activity: Insufficiently Active (04/15/2024)   Received from [facility]   Exercise Vital Sign    On average, how many days per week do you engage in moderate to strenuous exercise (like a brisk walk)?: 2 days    On average, how many minutes do you engage in exercise at this level?: 60 min  Stress: Stress Concern Present (04/15/2024)   Received from [facility] - Occupational Stress Questionnaire    Do you feel stress - tense, restless, nervous, or anxious, or unable to sleep at night because your mind is troubled all the time - these days?: Very much   Social Connections: Socially Isolated (04/15/2024)   Received from [facility]   Social Network    How would you rate your social network (family, work, friends)?: Little participation, lonely and socially isolated  Depression (PHQ2-9): High Risk (09/24/2024)   Depression (PHQ2-9)    PHQ-2 Score: 12  Alcohol Screen: Medium Risk (07/22/2024)   Alcohol Screen    Last Alcohol Screening Score (AUDIT): 10  Housing: High Risk (04/15/2024)   Received from [facility]    In the last 12 months, was there a time when you were not able to pay the mortgage or rent on time?: Yes    In the past 12  months, how many times have you moved where you were living?: 2    At any time in the past 12 months, were you homeless or living in a shelter (including now)?: No  Utilities: Not At Risk (07/14/2024)   Epic    Threatened with loss of utilities: No  Health Literacy: Not on file    Allergies: Allergies[1]  Metabolic Disorder Labs: Lab Results  Component Value Date   HGBA1C 5.2 07/14/2024   MPG 102.54 07/14/2024   No results found for: PROLACTIN Lab Results  Component Value Date   CHOL 166 07/14/2024   TRIG 36 07/14/2024   HDL 86 07/14/2024   CHOLHDL 1.9 07/14/2024   VLDL 7 07/14/2024   LDLCALC 73 07/14/2024   Lab Results  Component Value Date   TSH 1.020 07/14/2024    Therapeutic Level Labs: No results found for: LITHIUM No results found for: VALPROATE No results found for: CBMZ  Current Medications: Current Outpatient Medications  Medication Sig Dispense Refill   ARIPiprazole  (ABILIFY ) 10 MG tablet Take 0.5 tablets (5 mg total) by mouth daily. 30 tablet 4   traZODone  (DESYREL ) 100 MG tablet Take 1 tablet (100 mg total) by mouth at bedtime. 30 tablet 4   No current facility-administered medications for this visit.     Musculoskeletal: Strength & Muscle Tone: within normal limits and telehealth visit Gait & Station: normal, telehealth visit Patient leans:  N/A  Psychiatric Specialty Exam: Review of Systems  There were no vitals taken for this visit.There is no height or weight on file to calculate BMI.  General Appearance: Well Groomed  Eye Contact:  Good  Speech:  Clear and Coherent and Normal Rate  Volume:  Normal  Mood:  Anxious and Depressed  Affect:  Appropriate and Congruent  Thought Process:  Coherent, Goal Directed, and Linear  Orientation:  Full (Time, Place, and Person)  Thought Content: WDL and Logical   Suicidal Thoughts:  No  Homicidal Thoughts:  No  Memory:  Immediate;   Good Recent;   Good Remote;   Good  Judgement:  Good  Insight:  Good  Psychomotor Activity:  Normal  Concentration:  Concentration: Good and Attention Span: Good  Recall:  Good  Fund of Knowledge: Good  Language: Good  Akathisia:  No  Handed:  Right  AIMS (if indicated): not done  Assets:  Communication Skills Desire for Improvement Financial Resources/Insurance Housing Leisure Time Physical Health Resilience Social Support Transportation  ADL's:  Intact  Cognition: WNL  Sleep:  Good   Screenings: AUDIT    Flowsheet Row Office Visit from 07/22/2024 in [facility] Admission (Discharged) from 01/29/2019 in [facility] INPATIENT BEHAVIORAL MEDICINE  Alcohol Use Disorder Identification Test Final Score (AUDIT) 10 4   GAD-7    Flowsheet Row Video Visit from 09/24/2024 in [facility] Office Visit from 07/22/2024 in [facility]  Total GAD-7 Score 6 5   PHQ2-9    Flowsheet Row Video Visit from 09/24/2024 in [facility] Office Visit from 07/22/2024 in [facility]  PHQ-2 Total Score 2 3  PHQ-9 Total Score 12 16   Flowsheet Row ED from 07/14/2024 in [facility] ED from 06/18/2024 in [facility] Department at [facility] Admission (Discharged) from 01/29/2019 in  [facility] INPATIENT BEHAVIORAL MEDICINE  C-SSRS RISK CATEGORY Moderate Risk No Risk High Risk     Assessment and Plan: Patient reports she  is doing well on her current medication although has issues sleeping at times.Today she is agreeable to increase trazodone  50 mg to 100 mg to help manage sleep. She will continue all her other medications as prescribed.  Patient given resources to other mental health providers as her insurance is not covered at [facility].  1. Alcohol abuse with alcohol-induced mood disorder (HCC)  Continue- ARIPiprazole  (ABILIFY ) 10 MG tablet; Take 0.5 tablets (5 mg total) by mouth daily.  Dispense: 30 tablet; Refill: 4  2. PTSD (post-traumatic stress disorder)  Increased- traZODone  (DESYREL ) 100 MG tablet; Take 1 tablet (100 mg total) by mouth at bedtime.  Dispense: 30 tablet; Refill: 4'   Collaboration of Care: Collaboration of Care: Other provider involved in patient's care AEB PCP  Patient/Guardian was advised Release of Information must be obtained prior to any record release in order to collaborate their care with an outside provider. Patient/Guardian was advised if they have not already done so to contact the registration department to sign all necessary forms in order for us  to release information regarding their care.   Consent: Patient/Guardian gives verbal consent for treatment and assignment of benefits for services provided during this visit. Patient/Guardian expressed understanding and agreed to proceed.    Zane FORBES Bach, NP 09/24/2024, 12:35 PM     [1]  Allergies Allergen Reactions   Lactose Intolerance (Gi)

## 2024-10-01 ENCOUNTER — Ambulatory Visit (HOSPITAL_COMMUNITY): Admitting: Mental Health

## 2024-10-01 ENCOUNTER — Encounter (HOSPITAL_COMMUNITY): Payer: Self-pay

## 2024-11-06 NOTE — Progress Notes (Unsigned)
 "  New Patient Visit  Subjective:     Patient ID: Holmes JINNY Ada, female    DOB: December 04, 1995, 29 y.o.   MRN: [id number]  No chief complaint on file.   HPI  Discussed the use of AI scribe software for clinical note transcription with the patient, who gave verbal consent to proceed.  History of Present Illness      ROS Per HPI  Outpatient Encounter Medications as of 11/07/2024  Medication Sig   ARIPiprazole  (ABILIFY ) 10 MG tablet Take 0.5 tablets (5 mg total) by mouth daily.   traZODone  (DESYREL ) 100 MG tablet Take 1 tablet (100 mg total) by mouth at bedtime.   No facility-administered encounter medications on file as of 11/07/2024.    Past Medical History:  Diagnosis Date   ADHD    Anxiety 12/18/2018   Patient states she has anxiety, unable to verify.   Bipolar depression (HCC)    Insomnia     No past surgical history on file.  No family history on file.  Social History   Socioeconomic History   Marital status: Single    Spouse name: Not on file   Number of children: Not on file   Years of education: Not on file   Highest education level: Not on file  Occupational History   Not on file  Tobacco Use   Smoking status: Some Days    Current packs/day: 0.10    Average packs/day: 0.1 packs/day for 10.0 years (1.0 ttl pk-yrs)    Types: Cigarettes   Smokeless tobacco: Never  Vaping Use   Vaping status: Never Used  Substance and Sexual Activity   Alcohol use: Yes    Comment: social drinker   Drug use: Not Currently    Comment: CBD oil now.    Sexual activity: Yes    Birth control/protection: None    Comment: Morning patient states she takes Morning after pill  every two weeks or when patient has mone to buy them. 2018/12/18  Other Topics Concern   Not on file  Social History Narrative   Not on file   Social Drivers of Health   Tobacco Use: High Risk (09/24/2024)   Patient History    Smoking Tobacco Use: Some Days    Smokeless Tobacco Use: Never    Passive  Exposure: Not on file  Financial Resource Strain: High Risk (04/15/2024)   Received from [facility]   Overall Financial Resource Strain (CARDIA)    How hard is it for you to pay for the very basics like food, housing, medical care, and heating?: Very hard  Food Insecurity: No Food Insecurity (07/14/2024)   Epic    Worried About Programme Researcher, Broadcasting/film/video in the Last Year: Never true    Ran Out of Food in the Last Year: Never true  Recent Concern: Food Insecurity - Food Insecurity Present (04/15/2024)   Received from [facility]   Epic    Within the past 12 months, you worried that your food would run out before you got the money to buy more.: Often true    Within the past 12 months, the food you bought just didn't last and you didn't have money to get more.: Often true  Transportation Needs: No Transportation Needs (07/14/2024)   Epic    Lack of Transportation (Medical): No    Lack of Transportation (Non-Medical): No  Recent Concern: Transportation Needs - Unmet Transportation Needs (04/15/2024)   Received from [facility]  In the past 12 months, has lack of transportation kept you from medical appointments or from getting medications?: Yes    In the past 12 months, has lack of transportation kept you from meetings, work, or from getting things needed for daily living?: Yes  Physical Activity: Insufficiently Active (04/15/2024)   Received from [facility]   Exercise Vital Sign    On average, how many days per week do you engage in moderate to strenuous exercise (like a brisk walk)?: 2 days    On average, how many minutes do you engage in exercise at this level?: 60 min  Stress: Stress Concern Present (04/15/2024)   Received from [facility] - Occupational Stress Questionnaire    Do you feel stress - tense, restless, nervous, or anxious, or unable to sleep at night because your mind is troubled all the time - these days?: Very much   Social Connections: Socially Isolated (04/15/2024)   Received from [facility]   Social Network    How would you rate your social network (family, work, friends)?: Little participation, lonely and socially isolated  Intimate Partner Violence: Not At Risk (07/14/2024)   Epic    Fear of Current or Ex-Partner: No    Emotionally Abused: No    Physically Abused: No    Sexually Abused: No  Depression (PHQ2-9): High Risk (09/24/2024)   Depression (PHQ2-9)    PHQ-2 Score: 12  Alcohol Screen: Medium Risk (07/22/2024)   Alcohol Screen    Last Alcohol Screening Score (AUDIT): 10  Housing: High Risk (04/15/2024)   Received from [facility]    In the last 12 months, was there a time when you were not able to pay the mortgage or rent on time?: Yes    In the past 12 months, how many times have you moved where you were living?: 2    At any time in the past 12 months, were you homeless or living in a shelter (including now)?: No  Utilities: Not At Risk (07/14/2024)   Epic    Threatened with loss of utilities: No  Health Literacy: Not on file       Objective:    There were no vitals taken for this visit.   Physical Exam Vitals and nursing note reviewed.  Constitutional:      General: She is not in acute distress.    Appearance: Normal appearance. She is normal weight.  HENT:     Head: Normocephalic and atraumatic.     Right Ear: External ear normal.     Left Ear: External ear normal.     Nose: Nose normal.     Mouth/Throat:     Mouth: Mucous membranes are moist.     Pharynx: Oropharynx is clear.  Eyes:     Extraocular Movements: Extraocular movements intact.     Pupils: Pupils are equal, round, and reactive to light.  Cardiovascular:     Rate and Rhythm: Normal rate and regular rhythm.     Pulses: Normal pulses.     Heart sounds: Normal heart sounds.  Pulmonary:     Effort: Pulmonary effort is normal. No respiratory distress.     Breath sounds: Normal breath sounds.  No wheezing, rhonchi or rales.  Musculoskeletal:        General: Normal range of motion.     Cervical back: Normal range of motion.     Right lower leg: No edema.  Left lower leg: No edema.  Lymphadenopathy:     Cervical: No cervical adenopathy.  Neurological:     General: No focal deficit present.     Mental Status: She is alert and oriented to person, place, and time.  Psychiatric:        Mood and Affect: Mood normal.        Thought Content: Thought content normal.     No results found for any visits on 11/07/24.      Assessment & Plan:   Assessment and Plan Assessment & Plan      No orders of the defined types were placed in this encounter.    No orders of the defined types were placed in this encounter.   No follow-ups on file.  Corean LITTIE Ku, FNP   "

## 2024-11-06 NOTE — Patient Instructions (Incomplete)
 Welcome to [facility]!  Thank you for choosing us  for your Primary Care needs.   We offer in person and video appointments for your convenience. You may call our office to schedule appointments, or you may schedule appointments with me through MyChart.   The best way to get in contact with me is via MyChart message. This will get to me faster than a phone call, unless there is an emergency, then please call 911.  The lab is located downstairs in the Sports Medicine building, we also have xray available there.

## 2024-11-07 ENCOUNTER — Ambulatory Visit: Admitting: Family Medicine

## 2024-11-07 DIAGNOSIS — F1014 Alcohol abuse with alcohol-induced mood disorder: Secondary | ICD-10-CM

## 2024-11-07 DIAGNOSIS — Z7689 Persons encountering health services in other specified circumstances: Secondary | ICD-10-CM

## 2024-11-07 DIAGNOSIS — F319 Bipolar disorder, unspecified: Secondary | ICD-10-CM
